# Patient Record
Sex: Male | Born: 1950
Health system: Southern US, Community
[De-identification: ages and names within clinical notes are randomized; demographics above are authoritative.]

## PROBLEM LIST (undated history)

## (undated) DIAGNOSIS — I10 Essential (primary) hypertension: Secondary | ICD-10-CM

## (undated) DIAGNOSIS — E785 Hyperlipidemia, unspecified: Secondary | ICD-10-CM

## (undated) DIAGNOSIS — R079 Chest pain, unspecified: Secondary | ICD-10-CM

## (undated) DIAGNOSIS — M1990 Unspecified osteoarthritis, unspecified site: Secondary | ICD-10-CM

## (undated) HISTORY — PX: OTHER SURGICAL HISTORY: SHX169

## (undated) HISTORY — DX: Essential (primary) hypertension: I10

## (undated) HISTORY — DX: Hyperlipidemia, unspecified: E78.5

## (undated) HISTORY — DX: Chest pain, unspecified: R07.9

## (undated) HISTORY — PX: KNEE ARTHROSCOPY: SUR90

## (undated) HISTORY — PX: TONSILLECTOMY: SUR1361

---

## 2003-11-20 ENCOUNTER — Ambulatory Visit (HOSPITAL_COMMUNITY): Admission: RE | Admit: 2003-11-20 | Discharge: 2003-11-20 | Payer: Self-pay | Admitting: *Deleted

## 2013-06-24 ENCOUNTER — Other Ambulatory Visit: Payer: Self-pay | Admitting: Orthopedic Surgery

## 2013-07-04 ENCOUNTER — Other Ambulatory Visit: Payer: Self-pay | Admitting: Orthopedic Surgery

## 2013-07-04 NOTE — Progress Notes (Signed)
Preoperative surgical orders have been place into the Epic hospital system for Verne Carrow on 07/04/2013, 12:47 PM  by Patrica Duel for surgery on 07/21/2013.  Preop Total Knee orders including Experal, PO Tylenol, and IV Decadron as long as there are no contraindications to the above medications. Avel Peace, PA-C

## 2013-07-15 ENCOUNTER — Other Ambulatory Visit (HOSPITAL_COMMUNITY): Payer: Self-pay | Admitting: Orthopedic Surgery

## 2013-07-15 NOTE — Patient Instructions (Addendum)
20 Scott Salas  07/15/2013   Your procedure is scheduled on: 07-21-2013  Report to [facility] at 625 AM.  Call this number if you have problems the morning of surgery [phone]   Remember:   Do not eat food or drink liquids :After Midnight.     Take these medicines the morning of surgery with A SIP OF WATER: Tramadol if needed                                SEE [facility] PREPARING FOR SURGERY SHEET             You may not have any metal on your body including hair pins and piercings  Do not wear jewelry, make-up.  Do not wear lotions, powders, or perfumes. You may wear deodorant.   Men may shave face and neck.  Do not bring valuables to the hospital. [facility] IS NOT RESPONSIBLE FOR VALUEABLES.  Contacts, dentures or bridgework may not be worn into surgery.  Leave suitcase in the car. After surgery it may be brought to your room.  For patients admitted to the hospital, checkout time is 11:00 AM the day of discharge.   Patients discharged the day of surgery will not be allowed to drive home.  Name and phone number of your driver:  Special Instructions: N/A   Please read over the following fact sheets that you were given: mrsa information, blood fact sheet, incentive spirometer sheet  Call Merleen Nicely  RN pre op nurse if needed [phone]    FAILURE TO FOLLOW THESE INSTRUCTIONS MAY RESULT IN THE CANCELLATION OF YOUR SURGERY.  PATIENT SIGNATURE___________________________________________  NURSE SIGNATURE_____________________________________________

## 2013-07-15 NOTE — Progress Notes (Signed)
Medical clearance note and ekg 05-19-2013 dr Molly Maduro fried on chart

## 2013-07-16 ENCOUNTER — Encounter (HOSPITAL_COMMUNITY): Payer: Self-pay

## 2013-07-16 ENCOUNTER — Encounter (HOSPITAL_COMMUNITY): Payer: Self-pay | Admitting: Pharmacy Technician

## 2013-07-16 ENCOUNTER — Encounter (HOSPITAL_COMMUNITY)
Admission: RE | Admit: 2013-07-16 | Discharge: 2013-07-16 | Disposition: A | Discharge status: Home / Self Care | Payer: BC Managed Care – PPO | Source: Ambulatory Visit | Attending: Orthopedic Surgery | Admitting: Orthopedic Surgery

## 2013-07-16 DIAGNOSIS — Z01812 Encounter for preprocedural laboratory examination: Secondary | ICD-10-CM | POA: Insufficient documentation

## 2013-07-16 DIAGNOSIS — Z01818 Encounter for other preprocedural examination: Secondary | ICD-10-CM | POA: Insufficient documentation

## 2013-07-16 HISTORY — DX: Unspecified osteoarthritis, unspecified site: M19.90

## 2013-07-16 LAB — SURGICAL PCR SCREEN
MRSA, PCR: NEGATIVE
Staphylococcus aureus: NEGATIVE

## 2013-07-16 LAB — URINALYSIS, ROUTINE W REFLEX MICROSCOPIC
Ketones, ur: NEGATIVE mg/dL
Nitrite: NEGATIVE
Specific Gravity, Urine: 1.024 (ref 1.005–1.030)
Urobilinogen, UA: 0.2 mg/dL (ref 0.0–1.0)

## 2013-07-16 LAB — COMPREHENSIVE METABOLIC PANEL
Albumin: 3.6 g/dL (ref 3.5–5.2)
BUN: 17 mg/dL (ref 6–23)
Calcium: 9.8 mg/dL (ref 8.4–10.5)
Creatinine, Ser: 0.98 mg/dL (ref 0.50–1.35)
GFR calc Af Amer: >90 mL/min (ref >90)
GFR calc non Af Amer: 88 mL/min — ABNORMAL LOW (ref >90)
Glucose, Bld: 94 mg/dL (ref 70–99)
Potassium: 4.9 mEq/L (ref 3.5–5.1)
Total Protein: 7.4 g/dL (ref 6.0–8.3)

## 2013-07-16 LAB — CBC
HCT: 41.8 % (ref 39.0–52.0)
MCH: 32.4 pg (ref 26.0–34.0)
MCHC: 34 g/dL (ref 30.0–36.0)
RDW: 12.2 % (ref 11.5–15.5)
WBC: 6.8 10*3/uL (ref 4.0–10.5)

## 2013-07-16 LAB — PROTIME-INR
INR: 0.98 (ref 0.00–1.49)
Prothrombin Time: 12.8 seconds (ref 11.6–15.2)

## 2013-07-16 LAB — APTT: aPTT: 26 seconds (ref 24–37)

## 2013-07-20 ENCOUNTER — Encounter (HOSPITAL_COMMUNITY): Payer: Self-pay | Admitting: Orthopedic Surgery

## 2013-07-20 ENCOUNTER — Other Ambulatory Visit: Payer: Self-pay | Admitting: Orthopedic Surgery

## 2013-07-20 NOTE — H&P (Signed)
TOTAL KNEE ADMISSION H&P  Patient is being admitted for right total knee arthroplasty.  Subjective:  Chief Complaint:right knee pain.  HPI: Scott Salas, 62 y.o. male, has a history of pain and functional disability in the right knee due to arthritis and has failed non-surgical conservative treatments for greater than 12 weeks to includeNSAID's and/or analgesics, viscosupplementation injections and activity modification.  Onset of symptoms was gradual, with gradually worsening course since that time. The patient noted prior procedures on the knee to include  arthroscopy on the right knee(s).  Patient currently rates pain in the right knee(s) at moderate with activity. Patient has worsening of pain with activity and weight bearing and pain that interferes with activities of daily living.  Patient has evidence of joint space narrowing and bone on bone changes by imaging studies. This patient has had failure of conservative measures. There is no active infection.  There are no active problems to display for this patient.  Past Medical History  Diagnosis Date  . Arthritis     Past Surgical History  Procedure Laterality Date  . Tonsillectomy      as child  . Acl left knee      left knee  . Knee arthroscopy      right     (Not in a hospital admission) No Known Allergies  History  Substance Use Topics  . Smoking status: Former Smoker  . Smokeless tobacco: Former User    Quit date: 07/03/1982  . Alcohol Use: Yes     Comment: occassionally    Family History  Problem Relation Age of Onset  . Heart failure Father   . Heart disease Father   . Cancer Maternal Grandfather      Review of Systems  Constitutional: Negative.   HENT: Negative.   Respiratory: Negative.   Cardiovascular: Negative.   Gastrointestinal: Negative.   Genitourinary: Negative.   Musculoskeletal: Positive for joint pain.  Neurological: Negative.     Objective:  Physical Exam  Constitutional: He appears  well-developed and well-nourished. No distress.  HENT:  Head: Normocephalic and atraumatic.  Eyes: EOM are normal. Pupils are equal, round, and reactive to light.  Neck: Neck supple.  Cardiovascular: Normal rate and regular rhythm.   Murmur (II/VI low pitched early systolic murmur over the aoritc area) heard. Respiratory: Breath sounds normal.  GI: Soft. Bowel sounds are normal. There is no tenderness.  Musculoskeletal:       Right knee: He exhibits decreased range of motion (5-125, moderate cepitus) and abnormal alignment (varus). He exhibits no effusion, no ecchymosis and no erythema. Tenderness found. Medial joint line and lateral joint line tenderness noted.    Vital signs in last 24 hours: Ht 70 Wt 189 BP 122/70 Resp 12  Labs:   Estimated body mass index is 28.28 kg/(m^2) as calculated from the following:   Height as of 07/16/13: 5' 9" (1.753 m).   Weight as of 07/16/13: 86.909 kg (191 lb 9.6 oz).   Imaging Review Plain radiographs demonstrate severe degenerative joint disease of the right knee(s). The overall alignment is varus. The bone quality appears to be good for age and reported activity level.  Assessment/Plan:  End stage arthritis, right knee   The patient history, physical examination, clinical judgment of the provider and imaging studies are consistent with end stage degenerative joint disease of the right knee(s) and total knee arthroplasty is deemed medically necessary. The treatment options including medical management, injection therapy arthroscopy and arthroplasty were discussed at   length. The risks and benefits of total knee arthroplasty were presented and reviewed. The risks due to aseptic loosening, infection, stiffness, patella tracking problems, thromboembolic complications and other imponderables were discussed. The patient acknowledged the explanation, agreed to proceed with the plan and consent was signed. Patient is being admitted for inpatient treatment  for surgery, pain control, PT, OT, prophylactic antibiotics, VTE prophylaxis, progressive ambulation and ADL's and discharge planning. The patient is planning to be discharged home with home health services.  Plan is for a Right Total Knee Replacement by Dr. Aluisio.  PCP - Dr. Robert Fried - The patient has been seen preoperatively and felt to be stable for surgery.  The patient WILL receive TXA (tranexamic acid) prior to surgery.    

## 2013-07-21 ENCOUNTER — Inpatient Hospital Stay (HOSPITAL_COMMUNITY)
Admission: RE | Admit: 2013-07-21 | Discharge: 2013-07-22 | DRG: 209 | Disposition: A | Discharge status: Home / Self Care | Payer: BC Managed Care – PPO | Source: Ambulatory Visit | Attending: Orthopedic Surgery | Admitting: Orthopedic Surgery

## 2013-07-21 ENCOUNTER — Encounter (HOSPITAL_COMMUNITY): Payer: Self-pay | Admitting: *Deleted

## 2013-07-21 ENCOUNTER — Encounter (HOSPITAL_COMMUNITY)
Admission: RE | Disposition: A | Discharge status: Home Health | Payer: Self-pay | Source: Ambulatory Visit | Attending: Orthopedic Surgery

## 2013-07-21 ENCOUNTER — Inpatient Hospital Stay (HOSPITAL_COMMUNITY): Payer: BC Managed Care – PPO | Admitting: Anesthesiology

## 2013-07-21 ENCOUNTER — Encounter (HOSPITAL_COMMUNITY): Payer: Self-pay | Admitting: Anesthesiology

## 2013-07-21 DIAGNOSIS — M1710 Unilateral primary osteoarthritis, unspecified knee: Principal | ICD-10-CM | POA: Diagnosis present

## 2013-07-21 DIAGNOSIS — E871 Hypo-osmolality and hyponatremia: Secondary | ICD-10-CM

## 2013-07-21 DIAGNOSIS — M179 Osteoarthritis of knee, unspecified: Secondary | ICD-10-CM | POA: Diagnosis present

## 2013-07-21 DIAGNOSIS — Z96651 Presence of right artificial knee joint: Secondary | ICD-10-CM

## 2013-07-21 DIAGNOSIS — Z01812 Encounter for preprocedural laboratory examination: Secondary | ICD-10-CM

## 2013-07-21 HISTORY — PX: TOTAL KNEE ARTHROPLASTY: SHX125

## 2013-07-21 LAB — TYPE AND SCREEN
ABO/RH(D): O POS
Antibody Screen: NEGATIVE

## 2013-07-21 SURGERY — ARTHROPLASTY, KNEE, TOTAL
Anesthesia: Spinal | Site: Knee | Laterality: Right | Wound class: Clean

## 2013-07-21 MED ORDER — SODIUM CHLORIDE 0.9 % IJ SOLN
INTRAMUSCULAR | Status: DC | PRN
  Administered 2013-07-21: 10:00:00

## 2013-07-21 MED ORDER — OXYCODONE HCL 5 MG PO TABS
5.0000 mg | ORAL_TABLET | ORAL | Status: DC | PRN
  Administered 2013-07-21 – 2013-07-22 (×6): 10 mg via ORAL
  Filled 2013-07-21 (×4): qty 2
  Filled 2013-07-21: qty 1
  Filled 2013-07-21 (×2): qty 2

## 2013-07-21 MED ORDER — DEXAMETHASONE 6 MG PO TABS
10.0000 mg | ORAL_TABLET | Freq: Every day | ORAL | Status: AC
  Administered 2013-07-22: 10:00:00 10 mg via ORAL
  Filled 2013-07-21: qty 1

## 2013-07-21 MED ORDER — PHENOL 1.4 % MT LIQD: 1.0000 | OROMUCOSAL | Status: DC | PRN

## 2013-07-21 MED ORDER — BISACODYL 10 MG RE SUPP: 10.0000 mg | Freq: Every day | RECTAL | Status: DC | PRN

## 2013-07-21 MED ORDER — BUPIVACAINE HCL (PF) 0.25 % IJ SOLN
INTRAMUSCULAR | Status: AC
  Filled 2013-07-21: qty 30

## 2013-07-21 MED ORDER — MORPHINE SULFATE 2 MG/ML IJ SOLN
1.0000 mg | INTRAMUSCULAR | Status: DC | PRN
  Administered 2013-07-21: 2 mg via INTRAVENOUS
  Filled 2013-07-21: qty 1

## 2013-07-21 MED ORDER — BUPIVACAINE HCL 0.25 % IJ SOLN
INTRAMUSCULAR | Status: DC | PRN
  Administered 2013-07-21: 20 mL

## 2013-07-21 MED ORDER — RIVAROXABAN 10 MG PO TABS
10.0000 mg | ORAL_TABLET | Freq: Every day | ORAL | Status: DC
  Administered 2013-07-22: 10 mg via ORAL
  Filled 2013-07-21 (×2): qty 1

## 2013-07-21 MED ORDER — ONDANSETRON HCL 4 MG/2ML IJ SOLN: 4.0000 mg | Freq: Four times a day (QID) | INTRAMUSCULAR | Status: DC | PRN

## 2013-07-21 MED ORDER — METOCLOPRAMIDE HCL 10 MG PO TABS: 5.0000 mg | ORAL_TABLET | Freq: Three times a day (TID) | ORAL | Status: DC | PRN

## 2013-07-21 MED ORDER — DIPHENHYDRAMINE HCL 12.5 MG/5ML PO ELIX: 12.5000 mg | ORAL_SOLUTION | ORAL | Status: DC | PRN

## 2013-07-21 MED ORDER — CEFAZOLIN SODIUM-DEXTROSE 2-3 GM-% IV SOLR
INTRAVENOUS | Status: AC
  Filled 2013-07-21: qty 50

## 2013-07-21 MED ORDER — METHOCARBAMOL 500 MG PO TABS
500.0000 mg | ORAL_TABLET | Freq: Four times a day (QID) | ORAL | Status: DC | PRN
  Administered 2013-07-21 – 2013-07-22 (×2): 500 mg via ORAL
  Filled 2013-07-21 (×2): qty 1

## 2013-07-21 MED ORDER — DEXTROSE-NACL 5-0.9 % IV SOLN
INTRAVENOUS | Status: DC
  Administered 2013-07-21 – 2013-07-22 (×2): via INTRAVENOUS

## 2013-07-21 MED ORDER — BUPIVACAINE LIPOSOME 1.3 % IJ SUSP
20.0000 mL | Freq: Once | INTRAMUSCULAR | Status: DC
  Filled 2013-07-21: qty 20

## 2013-07-21 MED ORDER — PROPOFOL INFUSION 10 MG/ML OPTIME
INTRAVENOUS | Status: DC | PRN
  Administered 2013-07-21: 100 ug/kg/min via INTRAVENOUS

## 2013-07-21 MED ORDER — PROPOFOL 10 MG/ML IV BOLUS
INTRAVENOUS | Status: DC | PRN
  Administered 2013-07-21: 20 mg via INTRAVENOUS

## 2013-07-21 MED ORDER — METOCLOPRAMIDE HCL 5 MG/ML IJ SOLN: 5.0000 mg | Freq: Three times a day (TID) | INTRAMUSCULAR | Status: DC | PRN

## 2013-07-21 MED ORDER — LACTATED RINGERS IV SOLN: INTRAVENOUS | Status: DC

## 2013-07-21 MED ORDER — ONDANSETRON HCL 4 MG PO TABS: 4.0000 mg | ORAL_TABLET | Freq: Four times a day (QID) | ORAL | Status: DC | PRN

## 2013-07-21 MED ORDER — CEFAZOLIN SODIUM 1-5 GM-% IV SOLN
1.0000 g | Freq: Four times a day (QID) | INTRAVENOUS | Status: AC
  Administered 2013-07-21 – 2013-07-22 (×2): 1 g via INTRAVENOUS
  Filled 2013-07-21 (×2): qty 50

## 2013-07-21 MED ORDER — BUPIVACAINE IN DEXTROSE 0.75-8.25 % IT SOLN
INTRATHECAL | Status: DC | PRN
  Administered 2013-07-21: 2 mL via INTRATHECAL

## 2013-07-21 MED ORDER — FLUTICASONE PROPIONATE 50 MCG/ACT NA SUSP: 1.0000 | Freq: Every day | NASAL | Status: DC | PRN

## 2013-07-21 MED ORDER — DOCUSATE SODIUM 100 MG PO CAPS
100.0000 mg | ORAL_CAPSULE | Freq: Two times a day (BID) | ORAL | Status: DC
  Administered 2013-07-21 – 2013-07-22 (×2): 100 mg via ORAL

## 2013-07-21 MED ORDER — DEXAMETHASONE SODIUM PHOSPHATE 10 MG/ML IJ SOLN
10.0000 mg | Freq: Every day | INTRAMUSCULAR | Status: AC
  Filled 2013-07-21: qty 1

## 2013-07-21 MED ORDER — FENTANYL CITRATE 0.05 MG/ML IJ SOLN
INTRAMUSCULAR | Status: DC | PRN
  Administered 2013-07-21: 100 ug via INTRAVENOUS

## 2013-07-21 MED ORDER — KETAMINE HCL 10 MG/ML IJ SOLN
INTRAMUSCULAR | Status: DC | PRN
  Administered 2013-07-21 (×4): 10 mg via INTRAVENOUS

## 2013-07-21 MED ORDER — MENTHOL 3 MG MT LOZG
1.0000 | LOZENGE | OROMUCOSAL | Status: DC | PRN
  Filled 2013-07-21: qty 9

## 2013-07-21 MED ORDER — KETOROLAC TROMETHAMINE 15 MG/ML IJ SOLN
15.0000 mg | Freq: Four times a day (QID) | INTRAMUSCULAR | Status: AC | PRN
  Administered 2013-07-21 – 2013-07-22 (×3): 15 mg via INTRAVENOUS
  Filled 2013-07-21 (×3): qty 1

## 2013-07-21 MED ORDER — LACTATED RINGERS IV SOLN
INTRAVENOUS | Status: DC | PRN
  Administered 2013-07-21 (×2): via INTRAVENOUS

## 2013-07-21 MED ORDER — ACETAMINOPHEN 500 MG PO TABS
1000.0000 mg | ORAL_TABLET | Freq: Four times a day (QID) | ORAL | Status: AC
  Administered 2013-07-21 – 2013-07-22 (×4): 1000 mg via ORAL
  Filled 2013-07-21 (×5): qty 2

## 2013-07-21 MED ORDER — FLEET ENEMA 7-19 GM/118ML RE ENEM: 1.0000 | ENEMA | Freq: Once | RECTAL | Status: AC | PRN

## 2013-07-21 MED ORDER — ACETAMINOPHEN 500 MG PO TABS
1000.0000 mg | ORAL_TABLET | Freq: Once | ORAL | Status: AC
  Administered 2013-07-21: 1000 mg via ORAL
  Filled 2013-07-21: qty 2

## 2013-07-21 MED ORDER — DEXTROSE 5 % IV SOLN
500.0000 mg | Freq: Four times a day (QID) | INTRAVENOUS | Status: DC | PRN
  Administered 2013-07-21: 500 mg via INTRAVENOUS
  Filled 2013-07-21: qty 5

## 2013-07-21 MED ORDER — TRANEXAMIC ACID 100 MG/ML IV SOLN
1000.0000 mg | INTRAVENOUS | Status: AC
  Administered 2013-07-21: 1000 mg via INTRAVENOUS
  Filled 2013-07-21: qty 10

## 2013-07-21 MED ORDER — SODIUM CHLORIDE 0.9 % IJ SOLN
INTRAMUSCULAR | Status: AC
  Filled 2013-07-21: qty 50

## 2013-07-21 MED ORDER — TRAMADOL HCL 50 MG PO TABS: 50.0000 mg | ORAL_TABLET | Freq: Four times a day (QID) | ORAL | Status: DC | PRN

## 2013-07-21 MED ORDER — CEFAZOLIN SODIUM-DEXTROSE 2-3 GM-% IV SOLR
2.0000 g | INTRAVENOUS | Status: AC
  Administered 2013-07-21: 2 g via INTRAVENOUS

## 2013-07-21 MED ORDER — DEXAMETHASONE SODIUM PHOSPHATE 10 MG/ML IJ SOLN
10.0000 mg | Freq: Once | INTRAMUSCULAR | Status: AC
  Administered 2013-07-21: 10 mg via INTRAVENOUS

## 2013-07-21 MED ORDER — MIDAZOLAM HCL 5 MG/5ML IJ SOLN
INTRAMUSCULAR | Status: DC | PRN
  Administered 2013-07-21: 2 mg via INTRAVENOUS

## 2013-07-21 MED ORDER — SODIUM CHLORIDE 0.9 % IV SOLN: INTRAVENOUS | Status: DC

## 2013-07-21 MED ORDER — POLYETHYLENE GLYCOL 3350 17 G PO PACK: 17.0000 g | PACK | Freq: Every day | ORAL | Status: DC | PRN

## 2013-07-21 SURGICAL SUPPLY — 55 items
BAG ZIPLOCK 12X15 (MISCELLANEOUS) ×2 IMPLANT
BANDAGE ELASTIC 6 VELCRO ST LF (GAUZE/BANDAGES/DRESSINGS) ×2 IMPLANT
BANDAGE ESMARK 6X9 LF (GAUZE/BANDAGES/DRESSINGS) ×1 IMPLANT
BLADE SAG 18X100X1.27 (BLADE) ×2 IMPLANT
BLADE SAW SGTL 11.0X1.19X90.0M (BLADE) ×2 IMPLANT
BNDG ESMARK 6X9 LF (GAUZE/BANDAGES/DRESSINGS) ×2
BOWL SMART MIX CTS (DISPOSABLE) ×2 IMPLANT
CAPT RP KNEE ×2 IMPLANT
CEMENT HV SMART SET (Cement) ×4 IMPLANT
CLOTH BEACON ORANGE TIMEOUT ST (SAFETY) ×2 IMPLANT
CUFF TOURN SGL QUICK 34 (TOURNIQUET CUFF) ×1
CUFF TRNQT CYL 34X4X40X1 (TOURNIQUET CUFF) ×1 IMPLANT
DECANTER SPIKE VIAL GLASS SM (MISCELLANEOUS) ×2 IMPLANT
DRAPE EXTREMITY T 121X128X90 (DRAPE) ×2 IMPLANT
DRAPE POUCH INSTRU U-SHP 10X18 (DRAPES) ×2 IMPLANT
DRAPE U-SHAPE 47X51 STRL (DRAPES) ×2 IMPLANT
DRSG ADAPTIC 3X8 NADH LF (GAUZE/BANDAGES/DRESSINGS) ×2 IMPLANT
DRSG PAD ABDOMINAL 8X10 ST (GAUZE/BANDAGES/DRESSINGS) ×2 IMPLANT
DURAPREP 26ML APPLICATOR (WOUND CARE) ×2 IMPLANT
ELECT REM PT RETURN 9FT ADLT (ELECTROSURGICAL) ×2
ELECTRODE REM PT RTRN 9FT ADLT (ELECTROSURGICAL) ×1 IMPLANT
EVACUATOR 1/8 PVC DRAIN (DRAIN) ×2 IMPLANT
FACESHIELD LNG OPTICON STERILE (SAFETY) ×10 IMPLANT
GLOVE BIO SURGEON STRL SZ7.5 (GLOVE) ×2 IMPLANT
GLOVE BIO SURGEON STRL SZ8 (GLOVE) ×12 IMPLANT
GLOVE BIOGEL PI IND STRL 8 (GLOVE) ×2 IMPLANT
GLOVE BIOGEL PI INDICATOR 8 (GLOVE) ×2
GLOVE SURG SS PI 6.5 STRL IVOR (GLOVE) ×2 IMPLANT
GOWN PREVENTION PLUS LG XLONG (DISPOSABLE) ×2 IMPLANT
GOWN STRL REIN XL XLG (GOWN DISPOSABLE) ×6 IMPLANT
HANDPIECE INTERPULSE COAX TIP (DISPOSABLE) ×1
IMMOBILIZER KNEE 20 (SOFTGOODS) ×2
IMMOBILIZER KNEE 20 THIGH 36 (SOFTGOODS) ×1 IMPLANT
KIT BASIN OR (CUSTOM PROCEDURE TRAY) ×2 IMPLANT
MANIFOLD NEPTUNE II (INSTRUMENTS) ×2 IMPLANT
NDL SAFETY ECLIPSE 18X1.5 (NEEDLE) ×2 IMPLANT
NEEDLE HYPO 18GX1.5 SHARP (NEEDLE) ×2
NS IRRIG 1000ML POUR BTL (IV SOLUTION) ×2 IMPLANT
PACK TOTAL JOINT (CUSTOM PROCEDURE TRAY) ×2 IMPLANT
PADDING CAST COTTON 6X4 STRL (CAST SUPPLIES) ×6 IMPLANT
POSITIONER SURGICAL ARM (MISCELLANEOUS) ×2 IMPLANT
SET HNDPC FAN SPRY TIP SCT (DISPOSABLE) ×1 IMPLANT
SPONGE GAUZE 4X4 12PLY (GAUZE/BANDAGES/DRESSINGS) ×2 IMPLANT
STRIP CLOSURE SKIN 1/2X4 (GAUZE/BANDAGES/DRESSINGS) ×4 IMPLANT
SUCTION FRAZIER 12FR DISP (SUCTIONS) ×2 IMPLANT
SUT MNCRL AB 4-0 PS2 18 (SUTURE) ×2 IMPLANT
SUT VIC AB 2-0 CT1 27 (SUTURE) ×3
SUT VIC AB 2-0 CT1 TAPERPNT 27 (SUTURE) ×3 IMPLANT
SUT VLOC 180 0 24IN GS25 (SUTURE) IMPLANT
SYR 20CC LL (SYRINGE) ×2 IMPLANT
SYR 50ML LL SCALE MARK (SYRINGE) ×2 IMPLANT
TOWEL OR 17X26 10 PK STRL BLUE (TOWEL DISPOSABLE) ×4 IMPLANT
TRAY FOLEY CATH 14FRSI W/METER (CATHETERS) ×2 IMPLANT
WATER STERILE IRR 1500ML POUR (IV SOLUTION) ×4 IMPLANT
WRAP KNEE MAXI GEL POST OP (GAUZE/BANDAGES/DRESSINGS) ×2 IMPLANT

## 2013-07-21 NOTE — Interval H&P Note (Signed)
History and Physical Interval Note:  07/21/2013 7:01 AM  Scott Salas  has presented today for surgery, with the diagnosis of osteoarthritis of the right knee  The various methods of treatment have been discussed with the patient and family. After consideration of risks, benefits and other options for treatment, the patient has consented to  Procedure(s): RIGHT TOTAL KNEE ARTHROPLASTY (Right) as a surgical intervention .  The patient's history has been reviewed, patient examined, no change in status, stable for surgery.  I have reviewed the patient's chart and labs.  Questions were answered to the patient's satisfaction.     Loanne Drilling

## 2013-07-21 NOTE — Transfer of Care (Signed)
Immediate Anesthesia Transfer of Care Note  Patient: Scott Salas  Procedure(s) Performed: Procedure(s) (LRB): RIGHT TOTAL KNEE ARTHROPLASTY (Right)  Patient Location: PACU  Anesthesia Type: Spinal  Level of Consciousness: sedated, patient cooperative and responds to stimulation  Airway & Oxygen Therapy: Patient Spontanous Breathing and Patient connected to face mask oxgen  Post-op Assessment: Report given to PACU RN and Post -op Vital signs reviewed and stable  Post vital signs: Reviewed and stable Noted L1 level on exam denied pain.  Complications: No apparent anesthesia complications

## 2013-07-21 NOTE — Anesthesia Postprocedure Evaluation (Signed)
Anesthesia Post Note  Patient: Scott Salas  Procedure(s) Performed: Procedure(s) (LRB): RIGHT TOTAL KNEE ARTHROPLASTY (Right)  Anesthesia type: Spinal  Patient location: PACU  Post pain: Pain level controlled  Post assessment: Post-op Vital signs reviewed  Last Vitals: BP 132/87  Pulse 60  Temp(Src) 36.8 C (Oral)  Resp 16  Ht 5\' 9"  (1.753 m)  Wt 191 lb (86.637 kg)  BMI 28.19 kg/m2  SpO2 100%  Post vital signs: Reviewed  Level of consciousness: sedated  Complications: No apparent anesthesia complications

## 2013-07-21 NOTE — Progress Notes (Signed)
Utilization review completed.  

## 2013-07-21 NOTE — Op Note (Signed)
Pre-operative diagnosis- Osteoarthritis  Right knee(s)  Post-operative diagnosis- Osteoarthritis Right knee(s)  Procedure-  Right  Total Knee Arthroplasty  Surgeon- Scott Salas. Scott Kimbell, MD  Assistant- Scott Ped, PA-C   Anesthesia-  Spinal EBL-* No blood loss amount entered *  Drains Hemovac  Tourniquet time-  Total Tourniquet Time Documented: Thigh (Right) - 35 minutes Total: Thigh (Right) - 35 minutes    Complications- None  Condition-PACU - hemodynamically stable.   Brief Clinical Note  Scott Salas is a 62 y.o. year old male with end stage OA of his right knee with progressively worsening pain and dysfunction. He has constant pain, with activity and at rest and significant functional deficits with difficulties even with ADLs. He has had extensive non-op management including analgesics, injections of cortisone, and home exercise program, but remains in significant pain with significant dysfunction. Radiographs show bone on bone arthritis medial and patellofemoral. He presents now for rightt Total Knee Arthroplasty.    Procedure in detail---   The patient is brought into the operating room and positioned supine on the operating table. After successful administration of  Spinal,   a tourniquet is placed high on the  Right thigh(s) and the lower extremity is prepped and draped in the usual sterile fashion. Time out is performed by the operating team and then the  Right lower extremity is wrapped in Esmarch, knee flexed and the tourniquet inflated to 300 mmHg.       A midline incision is made with a ten blade through the subcutaneous tissue to the level of the extensor mechanism. A fresh blade is used to make a medial parapatellar arthrotomy. Soft tissue over the proximal medial tibia is subperiosteally elevated to the joint line with a knife and into the semimembranosus bursa with a Cobb elevator. Soft tissue over the proximal lateral tibia is elevated with attention being paid to  avoiding the patellar tendon on the tibial tubercle. The patella is everted, knee flexed 90 degrees and the ACL and PCL are removed. Findings are bone on bone medial and patellofemoral with large medial and patellar osteophytes.        The drill is used to create a starting hole in the distal femur and the canal is thoroughly irrigated with sterile saline to remove the fatty contents. The 5 degree Right  valgus alignment guide is placed into the femoral canal and the distal femoral cutting block is pinned to remove 10 mm off the distal femur. Resection is made with an oscillating saw.      The tibia is subluxed forward and the menisci are removed. The extramedullary alignment guide is placed referencing proximally at the medial aspect of the tibial tubercle and distally along the second metatarsal axis and tibial crest. The block is pinned to remove 2mm off the more deficient medial  side. Resection is made with an oscillating saw. Size 4is the most appropriate size for the tibia and the proximal tibia is prepared with the modular drill and keel punch for that size.      The femoral sizing guide is placed and size 4 is most appropriate. Rotation is marked off the epicondylar axis and confirmed by creating a rectangular flexion gap at 90 degrees. The size 4 cutting block is pinned in this rotation and the anterior, posterior and chamfer cuts are made with the oscillating saw. The intercondylar block is then placed and that cut is made.      Trial size 4 tibial component, trial size 4  posterior stabilized femur and a 10  mm posterior stabilized rotating platform insert trial is placed. Full extension is achieved with excellent varus/valgus and anterior/posterior balance throughout full range of motion. The patella is everted and thickness measured to be 27  mm. Free hand resection is taken to 15 mm, a 41 template is placed, lug holes are drilled, trial patella is placed, and it tracks normally. Osteophytes are  removed off the posterior femur with the trial in place. All trials are removed and the cut bone surfaces prepared with pulsatile lavage. Cement is mixed and once ready for implantation, the size 4 tibial implant, size  4 posterior stabilized femoral component, and the size 41 patella are cemented in place and the patella is held with the clamp. The trial insert is placed and the knee held in full extension. The Exparel (20 ml mixed with 30 ml saline) and .25% Bupivicaine, are injected into the extensor mechanism, posterior capsule, medial and lateral gutters and subcutaneous tissues.  All extruded cement is removed and once the cement is hard the permanent 10 mm posterior stabilized rotating platform insert is placed into the tibial tray.      The wound is copiously irrigated with saline solution and the extensor mechanism closed over a hemovac drain with #1 PDS suture. The tourniquet is released for a total tourniquet time of 35  minutes. Flexion against gravity is 140 degrees and the patella tracks normally. Subcutaneous tissue is closed with 2.0 vicryl and subcuticular with running 4.0 Monocryl. The incision is cleaned and dried and steri-strips and a bulky sterile dressing are applied. The limb is placed into a knee immobilizer and the patient is awakened and transported to recovery in stable condition.      Please note that a surgical assistant was a medical necessity for this procedure in order to perform it in a safe and expeditious manner. Surgical assistant was necessary to retract the ligaments and vital neurovascular structures to prevent injury to them and also necessary for proper positioning of the limb to allow for anatomic placement of the prosthesis.   Scott Salas Scott Lungren, MD    07/21/2013, 10:20 AM

## 2013-07-21 NOTE — Anesthesia Procedure Notes (Signed)
Spinal  Patient location during procedure: OR Start time: 07/21/2013 9:22 AM End time: 07/21/2013 9:25 AM Staffing Anesthesiologist: Lewie Loron R Performed by: anesthesiologist  Preanesthetic Checklist Completed: patient identified, site marked, surgical consent, pre-op evaluation, timeout performed, IV checked, risks and benefits discussed and monitors and equipment checked Spinal Block Patient position: sitting Prep: ChloraPrep and site prepped and draped Patient monitoring: heart rate, continuous pulse ox and blood pressure Approach: midline Location: L2-3 Injection technique: single-shot Needle Needle type: Sprotte  Needle gauge: 24 G Needle length: 9 cm Assessment Sensory level: T8 Additional Notes Expiration date of kit checked and confirmed. Patient tolerated procedure well, without complications.

## 2013-07-21 NOTE — H&P (View-Only) (Signed)
TOTAL KNEE ADMISSION H&P  Patient is being admitted for right total knee arthroplasty.  Subjective:  Chief Complaint:right knee pain.  HPI: Scott Salas, 62 y.o. male, has a history of pain and functional disability in the right knee due to arthritis and has failed non-surgical conservative treatments for greater than 12 weeks to includeNSAID's and/or analgesics, viscosupplementation injections and activity modification.  Onset of symptoms was gradual, with gradually worsening course since that time. The patient noted prior procedures on the knee to include  arthroscopy on the right knee(s).  Patient currently rates pain in the right knee(s) at moderate with activity. Patient has worsening of pain with activity and weight bearing and pain that interferes with activities of daily living.  Patient has evidence of joint space narrowing and bone on bone changes by imaging studies. This patient has had failure of conservative measures. There is no active infection.  There are no active problems to display for this patient.  Past Medical History  Diagnosis Date  . Arthritis     Past Surgical History  Procedure Laterality Date  . Tonsillectomy      as child  . Acl left knee      left knee  . Knee arthroscopy      right     (Not in a hospital admission) No Known Allergies  History  Substance Use Topics  . Smoking status: Former Games developer  . Smokeless tobacco: Former Neurosurgeon    Quit date: 07/03/1982  . Alcohol Use: Yes     Comment: occassionally    Family History  Problem Relation Age of Onset  . Heart failure Father   . Heart disease Father   . Cancer Maternal Grandfather      Review of Systems  Constitutional: Negative.   HENT: Negative.   Respiratory: Negative.   Cardiovascular: Negative.   Gastrointestinal: Negative.   Genitourinary: Negative.   Musculoskeletal: Positive for joint pain.  Neurological: Negative.     Objective:  Physical Exam  Constitutional: He appears  well-developed and well-nourished. No distress.  HENT:  Head: Normocephalic and atraumatic.  Eyes: EOM are normal. Pupils are equal, round, and reactive to light.  Neck: Neck supple.  Cardiovascular: Normal rate and regular rhythm.   Murmur (II/VI low pitched early systolic murmur over the aoritc area) heard. Respiratory: Breath sounds normal.  GI: Soft. Bowel sounds are normal. There is no tenderness.  Musculoskeletal:       Right knee: He exhibits decreased range of motion (5-125, moderate cepitus) and abnormal alignment (varus). He exhibits no effusion, no ecchymosis and no erythema. Tenderness found. Medial joint line and lateral joint line tenderness noted.    Vital signs in last 24 hours: Ht 70 Wt 189 BP 122/70 Resp 12  Labs:   Estimated body mass index is 28.28 kg/(m^2) as calculated from the following:   Height as of 07/16/13: 5\' 9"  (1.753 m).   Weight as of 07/16/13: 86.909 kg (191 lb 9.6 oz).   Imaging Review Plain radiographs demonstrate severe degenerative joint disease of the right knee(s). The overall alignment is varus. The bone quality appears to be good for age and reported activity level.  Assessment/Plan:  End stage arthritis, right knee   The patient history, physical examination, clinical judgment of the provider and imaging studies are consistent with end stage degenerative joint disease of the right knee(s) and total knee arthroplasty is deemed medically necessary. The treatment options including medical management, injection therapy arthroscopy and arthroplasty were discussed at  length. The risks and benefits of total knee arthroplasty were presented and reviewed. The risks due to aseptic loosening, infection, stiffness, patella tracking problems, thromboembolic complications and other imponderables were discussed. The patient acknowledged the explanation, agreed to proceed with the plan and consent was signed. Patient is being admitted for inpatient treatment  for surgery, pain control, PT, OT, prophylactic antibiotics, VTE prophylaxis, progressive ambulation and ADL's and discharge planning. The patient is planning to be discharged home with home health services.  Plan is for a Right Total Knee Replacement by Dr. Lequita Halt.  PCP - Dr. Marinda Elk - The patient has been seen preoperatively and felt to be stable for surgery.  The patient WILL receive TXA (tranexamic acid) prior to surgery.

## 2013-07-21 NOTE — Anesthesia Preprocedure Evaluation (Signed)
Anesthesia Evaluation  Patient identified by MRN, date of birth, ID band Patient awake    Reviewed: Allergy & Precautions, H&P , NPO status , Patient's Chart, lab work & pertinent test results  Airway       Dental  (+) Dental Advisory Given   Pulmonary former smoker,          Cardiovascular negative cardio ROS      Neuro/Psych negative neurological ROS  negative psych ROS   GI/Hepatic negative GI ROS, Neg liver ROS,   Endo/Other  negative endocrine ROS  Renal/GU negative Renal ROS     Musculoskeletal negative musculoskeletal ROS (+)   Abdominal   Peds  Hematology negative hematology ROS (+)   Anesthesia Other Findings   Reproductive/Obstetrics                           Anesthesia Physical Anesthesia Plan  ASA: I  Anesthesia Plan: Spinal   Post-op Pain Management:    Induction:   Airway Management Planned:   Additional Equipment:   Intra-op Plan:   Post-operative Plan:   Informed Consent: I have reviewed the patients History and Physical, chart, labs and discussed the procedure including the risks, benefits and alternatives for the proposed anesthesia with the patient or authorized representative who has indicated his/her understanding and acceptance.   Dental advisory given  Plan Discussed with: CRNA  Anesthesia Plan Comments:         Anesthesia Quick Evaluation

## 2013-07-21 NOTE — Evaluation (Signed)
Physical Therapy Evaluation Patient Details Name: Scott Salas MRN: [id number] DOB: 09-Feb-1951 Today's Date: 07/21/2013 Time: 4098-1191 PT Time Calculation (min): 23 min  PT Assessment / Plan / Recommendation History of Present Illness  s/p R TKA  Clinical Impression  Pt will benefit from PT to address deficits below; He plans to borrow DME;     PT Assessment  Patient needs continued PT services    Follow Up Recommendations  Home health PT    Does the patient have the potential to tolerate intense rehabilitation      Barriers to Discharge        Equipment Recommendations   (plans to borrow)    Recommendations for Other Services     Frequency 7X/week    Precautions / Restrictions Precautions Precautions: Knee Precaution Comments: I SLR today Required Braces or Orthoses: Knee Immobilizer - Right Knee Immobilizer - Right: Discontinue once straight leg raise with < 10 degree lag Restrictions RLE Weight Bearing: Weight bearing as tolerated Other Position/Activity Restrictions: WBAT   Pertinent Vitals/Pain No c/o      Mobility  Bed Mobility Bed Mobility: Supine to Sit Supine to Sit: 4: Min guard;5: Supervision Details for Bed Mobility Assistance: cues for self assist, technique Transfers Transfers: Sit to Stand;Stand to Sit Sit to Stand: 4: Min guard Stand to Sit: 4: Min guard Details for Transfer Assistance: cues for hand placement and RLE position Ambulation/Gait Ambulation/Gait Assistance: 4: Min guard Ambulation Distance (Feet): 140 Feet Assistive device: Rolling walker Ambulation/Gait Assistance Details: cues for sequence, heel to toe Gait Pattern: Step-to pattern;Step-through pattern    Exercises Total Joint Exercises Ankle Circles/Pumps: AROM;Both;10 reps Quad Sets: 5 reps;Both;AROM Straight Leg Raises: AROM;Right;5 reps   PT Diagnosis: Difficulty walking  PT Problem List: Decreased strength;Decreased range of motion;Decreased activity  tolerance;Decreased balance;Decreased mobility;Decreased knowledge of use of DME PT Treatment Interventions: Functional mobility training;Gait training;DME instruction;Stair training;Therapeutic activities;Therapeutic exercise;Patient/family education     PT Goals(Current goals can be found in the care plan section) Acute Rehab PT Goals Patient Stated Goal: I  PT Goal Formulation: With patient Time For Goal Achievement: 07/28/13 Potential to Achieve Goals: Good  Visit Information  Last PT Received On: 07/21/13 Assistance Needed: +1 History of Present Illness: s/p R TKA       Prior Functioning  Home Living Family/patient expects to be discharged to:: Private residence Living Arrangements: Spouse/significant other Available Help at Discharge: Family Type of Home: House Home Access: Stairs to enter Secretary/administrator of Steps: 5 Home Layout: Two level Alternate Level Stairs-Number of Steps: 5; pt will be able to stay on this level Additional Comments: can borrow walker, crutches Prior Function Level of Independence: Independent Communication Communication: No difficulties    Cognition  Cognition Arousal/Alertness: Awake/alert Behavior During Therapy: WFL for tasks assessed/performed Overall Cognitive Status: Within Functional Limits for tasks assessed    Extremity/Trunk Assessment Upper Extremity Assessment Upper Extremity Assessment: Overall WFL for tasks assessed Lower Extremity Assessment Lower Extremity Assessment: RLE deficits/detail RLE Deficits / Details: I SLR   Balance    End of Session PT - End of Session Equipment Utilized During Treatment: Gait belt Activity Tolerance: Patient tolerated treatment well Patient left: with call bell/phone within reach;in chair Nurse Communication: Mobility status CPM Right Knee CPM Right Knee: Off  GP     [facility] 07/21/2013, 5:18 PM

## 2013-07-22 LAB — BASIC METABOLIC PANEL
Chloride: 99 mEq/L (ref 96–112)
GFR calc Af Amer: >90 mL/min (ref >90)
GFR calc non Af Amer: >90 mL/min (ref >90)
Potassium: 4.4 mEq/L (ref 3.5–5.1)
Sodium: 133 mEq/L — ABNORMAL LOW (ref 135–145)

## 2013-07-22 LAB — CBC
MCH: 33.4 pg (ref 26.0–34.0)
MCHC: 35.6 g/dL (ref 30.0–36.0)
MCV: 93.9 fL (ref 78.0–100.0)
Platelets: 301 10*3/uL (ref 150–400)
RDW: 12.1 % (ref 11.5–15.5)
WBC: 17.3 10*3/uL — ABNORMAL HIGH (ref 4.0–10.5)

## 2013-07-22 MED ORDER — METHOCARBAMOL 500 MG PO TABS: 500.0000 mg | ORAL_TABLET | Freq: Four times a day (QID) | ORAL | Status: DC | PRN

## 2013-07-22 MED ORDER — OXYCODONE HCL 5 MG PO TABS: 5.0000 mg | ORAL_TABLET | ORAL | Status: DC | PRN

## 2013-07-22 MED ORDER — RIVAROXABAN 10 MG PO TABS: 10.0000 mg | ORAL_TABLET | Freq: Every day | ORAL | Status: DC

## 2013-07-22 NOTE — Progress Notes (Signed)
Physical Therapy Treatment Patient Details Name: Scott Salas MRN: [id number] DOB: [birth year] Today's Date: 07/22/2013 Time: 1017-1040 PT Time Calculation (min): 23 min  PT Assessment / Plan / Recommendation  History of Present Illness s/p R TKA   PT Comments   Progressing well with mobility. Plan is for d/c home later today. Will see for a 2nd session to walk again. Pt performed exercises and practiced steps this am. Recommend HHPT. Pt states he has access to DME if needed.   Follow Up Recommendations  Home health PT     Does the patient have the potential to tolerate intense rehabilitation     Barriers to Discharge        Equipment Recommendations       Recommendations for Other Services    Frequency 7X/week   Progress towards PT Goals Progress towards PT goals: Progressing toward goals  Plan Current plan remains appropriate    Precautions / Restrictions Precautions Precautions: Knee Required Braces or Orthoses: Knee Immobilizer - Right Knee Immobilizer - Right: Discontinue once straight leg raise with < 10 degree lag (DC'd 10/6-able to SLR) Restrictions Weight Bearing Restrictions: No RLE Weight Bearing: Weight bearing as tolerated   Pertinent Vitals/Pain R knee with exercises 5/10. Ice applied end of session    Mobility  Bed Mobility Bed Mobility: Not assessed Supine to Sit: HOB flat;5: Supervision Details for Bed Mobility Assistance: pt moves quickly Transfers Transfers: Sit to Stand;Stand to Sit Sit to Stand: 6: Modified independent (Device/Increase time);From chair/3-in-1 Stand to Sit: 5: Supervision;To chair/3-in-1 Details for Transfer Assistance: VCs safety, hand placement Ambulation/Gait Ambulation/Gait Assistance: 5: Supervision Ambulation Distance (Feet): 150 Feet Assistive device: Rolling walker Gait Pattern: Step-to pattern;Step-through pattern;Antalgic;Right flexed knee in stance Stairs: Yes Stairs Assistance: 4: Min guard Stairs Assistance  Details (indicate cue type and reason): VCs safety, technique, sequence.  Stair Management Technique: One rail Left;Forwards;With crutches Number of Stairs: 4    Exercises Total Joint Exercises Ankle Circles/Pumps: AROM;Both;15 reps;Seated Quad Sets: AROM;Both;15 reps;Seated Hip ABduction/ADduction: AROM;Right;15 reps;Seated Straight Leg Raises: AROM;Right;15 reps;Seated Knee Flexion: AAROM;Right;5 reps;Seated Goniometric ROM: 10-90 degrees   PT Diagnosis:    PT Problem List:   PT Treatment Interventions:     PT Goals (current goals can now be found in the care plan section) Acute Rehab PT Goals Patient Stated Goal: home   Visit Information  Last PT Received On: 07/22/13 Assistance Needed: +1 History of Present Illness: s/p R TKA    Subjective Data  Patient Stated Goal: home    Cognition  Cognition Arousal/Alertness: Awake/alert Behavior During Therapy: WFL for tasks assessed/performed Overall Cognitive Status: Within Functional Limits for tasks assessed    Balance     End of Session PT - End of Session Equipment Utilized During Treatment: Gait belt Activity Tolerance: Patient tolerated treatment well Patient left: in chair;with call bell/phone within reach   GP     Rebeca Alert, MPT Pager: [phone]

## 2013-07-22 NOTE — Evaluation (Signed)
Occupational Therapy Evaluation Patient Details Name: Scott Salas MRN: [id number] DOB: 18-Apr-1951 Today's Date: 07/22/2013 Time: 5621-3086 OT Time Calculation (min): 20 min  OT Assessment / Plan / Recommendation History of present illness s/p R TKA   Clinical Impression   Pt was admitted for R TKA.  All education was completed but will follow pt in acute setting for increased safety as pt tends to move quickly.      OT Assessment  Patient needs continued OT Services    Follow Up Recommendations  No OT follow up    Barriers to Discharge      Equipment Recommendations  None recommended by OT (has 3:1; may get shower seat due to space in shower)    Recommendations for Other Services    Frequency  Min 2X/week    Precautions / Restrictions Precautions Precautions: Knee Restrictions Weight Bearing Restrictions: Yes RLE Weight Bearing: Weight bearing as tolerated   Pertinent Vitals/Pain 2/10 R knee; reapplied ice and repositioned.  Sats 100% on RA    ADL  Grooming: Supervision/safety Where Assessed - Grooming: Supported standing Toilet Transfer: Hydrographic surveyor Method: Sit to Barista: Raised toilet seat with arms (or 3-in-1 over toilet) Tub/Shower Transfer: Insurance risk surveyor Method: Science writer: Walk in Scientist, research (physical sciences) Used: Rolling walker Transfers/Ambulation Related to ADLs: ambulated to bathroom.  Pt needs min cues for safety as he tends to move quickly.   ADL Comments: Stood to use urinal with min guard, standing against chair.  Min A for LB ADLs, simulated today--wife will assist    OT Diagnosis:    OT Problem List: Decreased safety awareness OT Treatment Interventions: Self-care/ADL training;DME and/or AE instruction;Patient/family education   OT Goals(Current goals can be found in the care plan section) Acute Rehab OT Goals Patient Stated Goal: home  OT Goal Formulation: With  patient Time For Goal Achievement: 07/29/13 Potential to Achieve Goals: Good ADL Goals Additional ADL Goal #1: Pt will not need any safety cues for toilet and shower transfers given supervision  Visit Information  Last OT Received On: 07/22/13 Assistance Needed: +1 History of Present Illness: s/p R TKA       Prior Functioning     Home Living Family/patient expects to be discharged to:: Private residence Living Arrangements: Spouse/significant other Available Help at Discharge: Family Type of Home: House Home Access: Stairs to enter Secretary/administrator of Steps: 5 Home Equipment: Bedside commode Prior Function Level of Independence: Independent Communication Communication: No difficulties         Vision/Perception     Cognition  Cognition Arousal/Alertness: Awake/alert Behavior During Therapy: WFL for tasks assessed/performed Overall Cognitive Status: Within Functional Limits for tasks assessed    Extremity/Trunk Assessment Upper Extremity Assessment Upper Extremity Assessment: Overall WFL for tasks assessed     Mobility Bed Mobility Bed Mobility: Supine to Sit Supine to Sit: HOB flat;5: Supervision Details for Bed Mobility Assistance: pt moves quickly Transfers Sit to Stand: 5: Supervision;From bed;From chair/3-in-1 Details for Transfer Assistance: cues for hand placement     Exercise     Balance     End of Session OT - End of Session Activity Tolerance: Patient tolerated treatment well Patient left: in chair;with call bell/phone within reach;with family/visitor present  GO     Magdelene Ruark 07/22/2013, 9:23 AM Marica Otter, OTR/L [phone] 07/22/2013

## 2013-07-22 NOTE — Progress Notes (Signed)
Physical Therapy Treatment Patient Details Name: Scott Salas MRN: [id number] DOB: Jul 15, 1951 Today's Date: 07/22/2013 Time: 4098-1191 PT Time Calculation (min): 8 min  PT Assessment / Plan / Recommendation  History of Present Illness s/p R TKA   PT Comments   2nd session. Pt continues to perform well. Denies pain with ambulation but rates it 5/10 with ROM (bending of knee). All education completed. No further questions from pt. Ready to d/c from PT standpoint.   Follow Up Recommendations  Home health PT     Does the patient have the potential to tolerate intense rehabilitation     Barriers to Discharge        Equipment Recommendations       Recommendations for Other Services    Frequency 7X/week   Progress towards PT Goals Progress towards PT goals: Progressing toward goals  Plan Current plan remains appropriate    Precautions / Restrictions Precautions Precautions: Knee Required Braces or Orthoses: Knee Immobilizer - Right Knee Immobilizer - Right: Discontinue once straight leg raise with < 10 degree lag (KI DC'd 10/6) Restrictions Weight Bearing Restrictions: No RLE Weight Bearing: Weight bearing as tolerated   Pertinent Vitals/Pain 0/10 pain with ambulation "knee still feels numb"; 5/10 with ROM exercises. Ice applied end of session    Mobility  Transfers Transfers: Sit to Stand;Stand to Sit Sit to Stand: 6: Modified independent (Device/Increase time);From chair/3-in-1 Stand to Sit: 6: Modified independent (Device/Increase time);To chair/3-in-1 Details for Transfer Assistance: VCs safety, hand placement Ambulation/Gait Ambulation/Gait Assistance: 6: Modified independent (Device/Increase time) Ambulation Distance (Feet): 160 Feet Assistive device: Rolling walker Ambulation/Gait Assistance Details: Pt denies pain with ambulation.  Gait Pattern: Step-through pattern Stairs: No-practiced earlier this am   Exercises    PT Diagnosis:    PT Problem List:   PT  Treatment Interventions:     PT Goals (current goals can now be found in the care plan section) Acute Rehab PT Goals Patient Stated Goal: home   Visit Information  Last PT Received On: 07/22/13 Assistance Needed: +1 History of Present Illness: s/p R TKA    Subjective Data  Patient Stated Goal: home    Cognition  Cognition Arousal/Alertness: Awake/alert Behavior During Therapy: WFL for tasks assessed/performed Overall Cognitive Status: Within Functional Limits for tasks assessed    Balance     End of Session PT - End of Session Equipment Utilized During Treatment: Gait belt Activity Tolerance: Patient tolerated treatment well Patient left: in chair;with call bell/phone within reach   GP     Rebeca Alert, MPT Pager: [phone]

## 2013-07-22 NOTE — Progress Notes (Signed)
   Subjective: 1 Day Post-Op Procedure(s) (LRB): RIGHT TOTAL KNEE ARTHROPLASTY (Right) Patient reports pain as mild.   Patient seen in rounds for Dr. Lequita Halt. Patient is well, and has had no acute complaints or problems Patient may be ready to go home later today if meets all goals.  Objective: Vital signs in last 24 hours: Temp:  [97.5 F (36.4 C)-98.9 F (37.2 C)] 97.5 F (36.4 C) (10/07 7829) Pulse Rate:  [55-66] 60 (10/07 0638) Resp:  [12-18] 14 (10/07 0638) BP: (106-132)/(56-89) 113/72 mmHg (10/07 0638) SpO2:  [95 %-100 %] 95 % (10/07 0638) FiO2 (%):  [100 %] 100 % (10/06 1145) Weight:  [86.637 kg (191 lb)] 86.637 kg (191 lb) (10/06 1145)  Intake/Output from previous day:  Intake/Output Summary (Last 24 hours) at 07/22/13 0958 Last data filed at 07/22/13 0800  Gross per 24 hour  Intake 2376.67 ml  Output   4241 ml  Net -1864.33 ml    Intake/Output this shift: Total I/O In: 100 [P.O.:100] Out: -   Labs:  Recent Labs  07/22/13 0430  HGB 12.6*    Recent Labs  07/22/13 0430  WBC 17.3*  RBC 3.77*  HCT 35.4*  PLT 301    Recent Labs  07/22/13 0430  NA 133*  K 4.4  CL 99  CO2 23  BUN 12  CREATININE 0.61  GLUCOSE 164*  CALCIUM 9.3   No results found for this basename: LABPT, INR,  in the last 72 hours  EXAM: General - Patient is Alert, Appropriate and Oriented Extremity - Neurovascular intact Sensation intact distally Dorsiflexion/Plantar flexion intact Dressing - clean, dry, no drainage Motor Function - intact, moving foot and toes well on exam.   Assessment/Plan: 1 Day Post-Op Procedure(s) (LRB): RIGHT TOTAL KNEE ARTHROPLASTY (Right) Procedure(s) (LRB): RIGHT TOTAL KNEE ARTHROPLASTY (Right) Past Medical History  Diagnosis Date  . Arthritis    Principal Problem:   OA (osteoarthritis) of knee  Estimated body mass index is 28.19 kg/(m^2) as calculated from the following:   Height as of this encounter: 5\' 9"  (1.753 m).   Weight as of  this encounter: 86.637 kg (191 lb). Advance diet Up with therapy Discharge home with home health Diet - Regular diet Follow up - in 2 weeks Activity - WBAT Disposition - Home Condition Upon Discharge - Good D/C Meds - See DC Summary DVT Prophylaxis - Xarelto  [place] 07/22/2013, 9:58 AM

## 2013-07-22 NOTE — Care Management Note (Signed)
    Page 1 of 1   07/22/2013     3:44:53 PM   CARE MANAGEMENT NOTE 07/22/2013  Patient:  Scott Salas, Scott Salas   Account Number:  [id number]  Date Initiated:  07/22/2013  Documentation initiated by:  Colleen Can  Subjective/Objective Assessment:   dx OA right knee; total knee replacemnt     Action/Plan:   CM spoke with patient. Plans are for patient to return to his home in [place] where spouse will be caregiver. He already has DME-RW, commode seat and crutches> Genevieve Norlander will provide [facility] services.   Anticipated DC Date:  07/22/2013   Anticipated DC Plan:  HOME W HOME HEALTH SERVICES      DC Planning Services  CM consult      [facility] Choice  HOME HEALTH   Choice offered to / List presented to:  C-1 Patient        HH arranged  HH-2 PT      Status of service:  Completed, signed off Medicare Important Message given?  NA - LOS <3 / Initial given by admissions (If response is "NO", the following Medicare IM given date fields will be blank) Date Medicare IM given:   Date Additional Medicare IM given:    Discharge Disposition:  HOME W HOME HEALTH SERVICES  Per UR Regulation:    If discussed at Long Length of Stay Meetings, dates discussed:    Comments:  07/22/2013 Colleen Can BSN RN CCM [phone] ]Pt discharged to home with [facility] services to start tomorrow 10/0-05/2013.

## 2013-07-22 NOTE — Discharge Summary (Signed)
Physician Discharge Summary   Patient ID: Scott Salas MRN: [id number] DOB/AGE: [birth year] 60 y.o.  Admit date: 07/21/2013 Discharge date: 07/22/2013  Primary Diagnosis:  Osteoarthritis Right knee(s)  Admission Diagnoses:  Past Medical History  Diagnosis Date  . Arthritis    Discharge Diagnoses:   Principal Problem:   OA (osteoarthritis) of knee Active Problems:   Hyponatremia  Estimated body mass index is 28.19 kg/(m^2) as calculated from the following:   Height as of this encounter: 5\' 9"  (1.753 m).   Weight as of this encounter: 86.637 kg (191 lb).  Procedure:  Procedure(s) (LRB): RIGHT TOTAL KNEE ARTHROPLASTY (Right)   Consults: None  HPI: Scott Salas is a 62 y.o. year old male with end stage OA of his right knee with progressively worsening pain and dysfunction. He has constant pain, with activity and at rest and significant functional deficits with difficulties even with ADLs. He has had extensive non-op management including analgesics, injections of cortisone, and home exercise program, but remains in significant pain with significant dysfunction. Radiographs show bone on bone arthritis medial and patellofemoral. He presents now for rightt Total Knee Arthroplasty.  Laboratory Data: Admission on 07/21/2013, Discharged on 07/22/2013  Component Date Value Range Status  . ABO/RH(D) 07/21/2013 O POS   Final  . Antibody Screen 07/21/2013 NEG   Final  . Sample Expiration 07/21/2013 07/24/2013   Final  . ABO/RH(D) 07/21/2013 O POS   Final  . WBC 07/22/2013 17.3* 4.0 - 10.5 K/uL Final  . RBC 07/22/2013 3.77* 4.22 - 5.81 MIL/uL Final  . Hemoglobin 07/22/2013 12.6* 13.0 - 17.0 g/dL Final  . HCT 40/98/1191 35.4* 39.0 - 52.0 % Final  . MCV 07/22/2013 93.9  78.0 - 100.0 fL Final  . MCH 07/22/2013 33.4  26.0 - 34.0 pg Final  . MCHC 07/22/2013 35.6  30.0 - 36.0 g/dL Final  . RDW 47/82/9562 12.1  11.5 - 15.5 % Final  . Platelets 07/22/2013 301  150 - 400 K/uL Final  .  Sodium 07/22/2013 133* 135 - 145 mEq/L Final  . Potassium 07/22/2013 4.4  3.5 - 5.1 mEq/L Final  . Chloride 07/22/2013 99  96 - 112 mEq/L Final  . CO2 07/22/2013 23  19 - 32 mEq/L Final  . Glucose, Bld 07/22/2013 164* 70 - 99 mg/dL Final  . BUN 13/05/6577 12  6 - 23 mg/dL Final  . Creatinine, Ser 07/22/2013 0.61  0.50 - 1.35 mg/dL Final  . Calcium 46/96/2952 9.3  8.4 - 10.5 mg/dL Final  . GFR calc non Af Amer 07/22/2013 >90  >90 mL/min Final  . GFR calc Af Amer 07/22/2013 >90  >90 mL/min Final   Comment: (NOTE)                          The eGFR has been calculated using the CKD EPI equation.                          This calculation has not been validated in all clinical situations.                          eGFR's persistently <90 mL/min signify possible Chronic Kidney                          Disease.  Hospital Outpatient Visit on 07/16/2013  Component Date Value Range  Status  . MRSA, PCR 07/16/2013 NEGATIVE  NEGATIVE Final  . Staphylococcus aureus 07/16/2013 NEGATIVE  NEGATIVE Final   Comment:                                 The Xpert SA Assay (FDA                          approved for NASAL specimens                          in patients over 60 years of age),                          is one component of                          a comprehensive surveillance                          program.  Test performance has                          been validated by [employer] for patients greater                          than or equal to 71 year old.                          It is not intended                          to diagnose infection nor to                          guide or monitor treatment.                          Performed at [facility]  . aPTT 07/16/2013 26  24 - 37 seconds Final  . WBC 07/16/2013 6.8  4.0 - 10.5 K/uL Final  . RBC 07/16/2013 4.38  4.22 - 5.81 MIL/uL Final  . Hemoglobin 07/16/2013 14.2  13.0 - 17.0 g/dL Final  . HCT 96/01/5408  41.8  39.0 - 52.0 % Final  . MCV 07/16/2013 95.4  78.0 - 100.0 fL Final  . MCH 07/16/2013 32.4  26.0 - 34.0 pg Final  . MCHC 07/16/2013 34.0  30.0 - 36.0 g/dL Final  . RDW 81/19/1478 12.2  11.5 - 15.5 % Final  . Platelets 07/16/2013 286  150 - 400 K/uL Final  . Sodium 07/16/2013 139  135 - 145 mEq/L Final  . Potassium 07/16/2013 4.9  3.5 - 5.1 mEq/L Final  . Chloride 07/16/2013 101  96 - 112 mEq/L Final  . CO2 07/16/2013 29  19 - 32 mEq/L Final  . Glucose, Bld 07/16/2013 94  70 - 99 mg/dL Final  . BUN 29/56/2130 17  6 - 23 mg/dL Final  . Creatinine, Ser 07/16/2013 0.98  0.50 - 1.35 mg/dL Final  . Calcium 19/14/7829 9.8  8.4 - 10.5 mg/dL Final  . Total Protein 07/16/2013 7.4  6.0 - 8.3 g/dL Final  . Albumin 56/21/3086 3.6  3.5 - 5.2 g/dL Final  . AST 57/84/6962 23  0 - 37 U/L Final  . ALT 07/16/2013 23  0 - 53 U/L Final  . Alkaline Phosphatase 07/16/2013 67  39 - 117 U/L Final  . Total Bilirubin 07/16/2013 0.4  0.3 - 1.2 mg/dL Final  . GFR calc non Af Amer 07/16/2013 88* >90 mL/min Final  . GFR calc Af Amer 07/16/2013 >90  >90 mL/min Final   Comment: (NOTE)                          The eGFR has been calculated using the CKD EPI equation.                          This calculation has not been validated in all clinical situations.                          eGFR's persistently <90 mL/min signify possible Chronic Kidney                          Disease.  Marland Kitchen Prothrombin Time 07/16/2013 12.8  11.6 - 15.2 seconds Final  . INR 07/16/2013 0.98  0.00 - 1.49 Final  . Color, Urine 07/16/2013 YELLOW  YELLOW Final  . APPearance 07/16/2013 CLEAR  CLEAR Final  . Specific Gravity, Urine 07/16/2013 1.024  1.005 - 1.030 Final  . pH 07/16/2013 6.0  5.0 - 8.0 Final  . Glucose, UA 07/16/2013 NEGATIVE  NEGATIVE mg/dL Final  . Hgb urine dipstick 07/16/2013 NEGATIVE  NEGATIVE Final  . Bilirubin Urine 07/16/2013 NEGATIVE  NEGATIVE Final  . Ketones, ur 07/16/2013 NEGATIVE  NEGATIVE mg/dL Final  . Protein, ur  95/28/4132 NEGATIVE  NEGATIVE mg/dL Final  . Urobilinogen, UA 07/16/2013 0.2  0.0 - 1.0 mg/dL Final  . Nitrite 44/10/270 NEGATIVE  NEGATIVE Final  . Leukocytes, UA 07/16/2013 NEGATIVE  NEGATIVE Final   MICROSCOPIC NOT DONE ON URINES WITH NEGATIVE PROTEIN, BLOOD, LEUKOCYTES, NITRITE, OR GLUCOSE <1000 mg/dL.     X-Rays:No results found.  EKG:No orders found for this or any previous visit.   Hospital Course: Scott Salas is a 62 y.o. who was admitted to [facility] They were brought to the operating room on 07/21/2013 and underwent Procedure(s): RIGHT TOTAL KNEE ARTHROPLASTY.  Patient tolerated the procedure well and was later transferred to the recovery room and then to the orthopaedic floor for postoperative care.  They were given PO and IV analgesics for pain control following their surgery.  They were given 24 hours of postoperative antibiotics of  Anti-infectives   Start     Dose/Rate Route Frequency Ordered Stop   07/21/13 1600  ceFAZolin (ANCEF) IVPB 1 g/50 mL premix     1 g 100 mL/hr over 30 Minutes Intravenous Every 6 hours 07/21/13 1147 07/22/13 0447   07/21/13 0630  ceFAZolin (ANCEF) IVPB 2 g/50 mL premix     2 g 100 mL/hr over 30 Minutes Intravenous On call to O.R. 07/21/13 5366 07/21/13 0926     and started on DVT prophylaxis in the form of Xarelto.   PT and OT were ordered for total joint protocol.  Discharge planning consulted to help with postop disposition and equipment needs.  Patient had a good night on the evening of surgery.  They started to get up OOB with therapy on day one. Hemovac drain was pulled without difficulty. Patient was seen in rounds and was ready to go home later that same day after meeting goals.   Discharge Medications: Prior to Admission medications   Medication Sig Start Date End Date Taking? Authorizing Provider  fluticasone (FLONASE) 50 MCG/ACT nasal spray Place 1 spray into the nose daily as needed.     Historical Provider, MD    methocarbamol (ROBAXIN) 500 MG tablet Take 1 tablet (500 mg total) by mouth every 6 (six) hours as needed. 07/22/13   Trevonn Hallum, PA-C  oxyCODONE (OXY IR/ROXICODONE) 5 MG immediate release tablet Take 1-2 tablets (5-10 mg total) by mouth every 3 (three) hours as needed. 07/22/13   Cordie Buening Julien Girt, PA-C  rivaroxaban (XARELTO) 10 MG TABS tablet Take 1 tablet (10 mg total) by mouth daily with breakfast. 07/22/13   Cornelius Schuitema Julien Girt, PA-C  traMADol (ULTRAM) 50 MG tablet Take 50 mg by mouth every 6 (six) hours as needed for pain.   Yes Historical Provider, MD   Discharge home with home health  Diet - Regular diet  Follow up - in 2 weeks  Activity - WBAT  Disposition - Home  Condition Upon Discharge - Good  D/C Meds - See DC Summary  DVT Prophylaxis - Xarelto       Discharge Orders   Future Orders Complete By Expires   Call MD / Call 911  As directed    Comments:     If you experience chest pain or shortness of breath, CALL 911 and be transported to the hospital emergency room.  If you develope a fever above 101 F, pus (white drainage) or increased drainage or redness at the wound, or calf pain, call your surgeon's office.   Change dressing  As directed    Comments:     Change dressing daily with sterile 4 x 4 inch gauze dressing and apply TED hose. Do not submerge the incision under water.   Constipation Prevention  As directed    Comments:     Drink plenty of fluids.  Prune juice may be helpful.  You may use a stool softener, such as Colace (over the counter) 100 mg twice a day.  Use MiraLax (over the counter) for constipation as needed.   Diet general  As directed    Discharge instructions  As directed    Comments:     Pick up stool softner and laxative for home. Do not submerge incision under water. May shower. Continue to use ice for pain and swelling from surgery. Hip precautions.  Total Hip Protocol.  Take Xarelto for two and a half more weeks, then discontinue  Xarelto.   Do not put a pillow under the knee. Place it under the heel.  As directed    Do not sit on low chairs, stoools or toilet seats, as it may be difficult to get up from low surfaces  As directed    Driving restrictions  As directed    Comments:     No driving until released by the physician.   Increase activity slowly as tolerated  As directed    Lifting restrictions  As directed    Comments:     No lifting until released by the physician.   Patient may shower  As directed  Comments:     You may shower without a dressing once there is no drainage.  Do not wash over the wound.  If drainage remains, do not shower until drainage stops.   TED hose  As directed    Comments:     Use stockings (TED hose) for 3 weeks on both leg(s).  You may remove them at night for sleeping.   Weight bearing as tolerated  As directed    Questions:     Laterality:     Extremity:         Medication List    STOP taking these medications       aspirin EC 81 MG tablet     fish oil-omega-3 fatty acids 1000 MG capsule     multivitamin with minerals Tabs tablet      TAKE these medications       fluticasone 50 MCG/ACT nasal spray  Commonly known as:  FLONASE  Place 1 spray into the nose daily as needed.     methocarbamol 500 MG tablet  Commonly known as:  ROBAXIN  Take 1 tablet (500 mg total) by mouth every 6 (six) hours as needed.     oxyCODONE 5 MG immediate release tablet  Commonly known as:  Oxy IR/ROXICODONE  Take 1-2 tablets (5-10 mg total) by mouth every 3 (three) hours as needed.     rivaroxaban 10 MG Tabs tablet  Commonly known as:  XARELTO  Take 1 tablet (10 mg total) by mouth daily with breakfast.     traMADol 50 MG tablet  Commonly known as:  ULTRAM  Take 50 mg by mouth every 6 (six) hours as needed for pain.       Follow-up Information   Follow up with Loanne Drilling, MD. Schedule an appointment as soon as possible for a visit on 08/05/2013.   Specialty:  Orthopedic  Surgery   Contact information:   [address] [place] [address] [phone]       Signed: Patrica Duel 08/01/2013, 9:17 AM

## 2013-08-01 DIAGNOSIS — E871 Hypo-osmolality and hyponatremia: Secondary | ICD-10-CM

## 2017-12-25 DIAGNOSIS — Z86010 Personal history of colonic polyps: Secondary | ICD-10-CM | POA: Diagnosis not present

## 2017-12-25 DIAGNOSIS — K640 First degree hemorrhoids: Secondary | ICD-10-CM | POA: Diagnosis not present

## 2017-12-25 DIAGNOSIS — K5730 Diverticulosis of large intestine without perforation or abscess without bleeding: Secondary | ICD-10-CM | POA: Diagnosis not present

## 2017-12-25 DIAGNOSIS — D126 Benign neoplasm of colon, unspecified: Secondary | ICD-10-CM | POA: Diagnosis not present

## 2017-12-28 DIAGNOSIS — D126 Benign neoplasm of colon, unspecified: Secondary | ICD-10-CM | POA: Diagnosis not present

## 2018-05-08 DIAGNOSIS — R7303 Prediabetes: Secondary | ICD-10-CM | POA: Diagnosis not present

## 2018-05-08 DIAGNOSIS — Z0000 Encounter for general adult medical examination without abnormal findings: Secondary | ICD-10-CM | POA: Diagnosis not present

## 2018-05-08 DIAGNOSIS — Z125 Encounter for screening for malignant neoplasm of prostate: Secondary | ICD-10-CM | POA: Diagnosis not present

## 2018-05-08 DIAGNOSIS — E7800 Pure hypercholesterolemia, unspecified: Secondary | ICD-10-CM | POA: Diagnosis not present

## 2018-05-08 DIAGNOSIS — Z23 Encounter for immunization: Secondary | ICD-10-CM | POA: Diagnosis not present

## 2018-05-08 DIAGNOSIS — E663 Overweight: Secondary | ICD-10-CM | POA: Diagnosis not present

## 2018-05-08 DIAGNOSIS — Z96651 Presence of right artificial knee joint: Secondary | ICD-10-CM | POA: Diagnosis not present

## 2018-05-08 DIAGNOSIS — Z86010 Personal history of colonic polyps: Secondary | ICD-10-CM | POA: Diagnosis not present

## 2018-05-08 DIAGNOSIS — N529 Male erectile dysfunction, unspecified: Secondary | ICD-10-CM | POA: Diagnosis not present

## 2018-05-20 DIAGNOSIS — Z96651 Presence of right artificial knee joint: Secondary | ICD-10-CM | POA: Diagnosis not present

## 2018-05-20 DIAGNOSIS — M1711 Unilateral primary osteoarthritis, right knee: Secondary | ICD-10-CM | POA: Diagnosis not present

## 2018-05-20 DIAGNOSIS — Z471 Aftercare following joint replacement surgery: Secondary | ICD-10-CM | POA: Diagnosis not present

## 2018-05-30 DIAGNOSIS — M25561 Pain in right knee: Secondary | ICD-10-CM | POA: Diagnosis not present

## 2018-05-30 DIAGNOSIS — Z96651 Presence of right artificial knee joint: Secondary | ICD-10-CM | POA: Diagnosis not present

## 2018-06-03 DIAGNOSIS — M25561 Pain in right knee: Secondary | ICD-10-CM | POA: Diagnosis not present

## 2018-06-03 DIAGNOSIS — Z96651 Presence of right artificial knee joint: Secondary | ICD-10-CM | POA: Diagnosis not present

## 2018-06-05 DIAGNOSIS — Z96651 Presence of right artificial knee joint: Secondary | ICD-10-CM | POA: Diagnosis not present

## 2018-06-05 DIAGNOSIS — M25561 Pain in right knee: Secondary | ICD-10-CM | POA: Diagnosis not present

## 2018-06-11 DIAGNOSIS — Z96651 Presence of right artificial knee joint: Secondary | ICD-10-CM | POA: Diagnosis not present

## 2018-06-11 DIAGNOSIS — M25561 Pain in right knee: Secondary | ICD-10-CM | POA: Diagnosis not present

## 2018-06-13 DIAGNOSIS — M25561 Pain in right knee: Secondary | ICD-10-CM | POA: Diagnosis not present

## 2018-06-13 DIAGNOSIS — Z96651 Presence of right artificial knee joint: Secondary | ICD-10-CM | POA: Diagnosis not present

## 2018-06-20 DIAGNOSIS — M25561 Pain in right knee: Secondary | ICD-10-CM | POA: Diagnosis not present

## 2018-06-20 DIAGNOSIS — Z96651 Presence of right artificial knee joint: Secondary | ICD-10-CM | POA: Diagnosis not present

## 2018-10-22 DIAGNOSIS — L723 Sebaceous cyst: Secondary | ICD-10-CM | POA: Diagnosis not present

## 2018-11-26 DIAGNOSIS — L723 Sebaceous cyst: Secondary | ICD-10-CM | POA: Diagnosis not present

## 2018-12-24 DIAGNOSIS — L723 Sebaceous cyst: Secondary | ICD-10-CM | POA: Diagnosis not present

## 2019-04-14 DIAGNOSIS — Z20828 Contact with and (suspected) exposure to other viral communicable diseases: Secondary | ICD-10-CM | POA: Diagnosis not present

## 2019-04-16 DIAGNOSIS — R05 Cough: Secondary | ICD-10-CM | POA: Diagnosis not present

## 2019-05-15 DIAGNOSIS — Z96651 Presence of right artificial knee joint: Secondary | ICD-10-CM | POA: Diagnosis not present

## 2019-05-15 DIAGNOSIS — R7303 Prediabetes: Secondary | ICD-10-CM | POA: Diagnosis not present

## 2019-05-15 DIAGNOSIS — Z125 Encounter for screening for malignant neoplasm of prostate: Secondary | ICD-10-CM | POA: Diagnosis not present

## 2019-05-15 DIAGNOSIS — Z0000 Encounter for general adult medical examination without abnormal findings: Secondary | ICD-10-CM | POA: Diagnosis not present

## 2019-05-15 DIAGNOSIS — N529 Male erectile dysfunction, unspecified: Secondary | ICD-10-CM | POA: Diagnosis not present

## 2019-05-15 DIAGNOSIS — E7800 Pure hypercholesterolemia, unspecified: Secondary | ICD-10-CM | POA: Diagnosis not present

## 2019-05-15 DIAGNOSIS — Z86010 Personal history of colonic polyps: Secondary | ICD-10-CM | POA: Diagnosis not present

## 2019-06-19 DIAGNOSIS — E7800 Pure hypercholesterolemia, unspecified: Secondary | ICD-10-CM | POA: Diagnosis not present

## 2019-06-19 DIAGNOSIS — Z125 Encounter for screening for malignant neoplasm of prostate: Secondary | ICD-10-CM | POA: Diagnosis not present

## 2019-06-19 DIAGNOSIS — R7303 Prediabetes: Secondary | ICD-10-CM | POA: Diagnosis not present

## 2019-06-19 DIAGNOSIS — Z23 Encounter for immunization: Secondary | ICD-10-CM | POA: Diagnosis not present

## 2019-09-15 ENCOUNTER — Emergency Department (HOSPITAL_COMMUNITY): Payer: PPO

## 2019-09-15 ENCOUNTER — Other Ambulatory Visit: Payer: Self-pay

## 2019-09-15 ENCOUNTER — Emergency Department (HOSPITAL_COMMUNITY)
Admission: EM | Admit: 2019-09-15 | Discharge: 2019-09-15 | Disposition: A | Discharge status: Home / Self Care | Payer: PPO | Attending: Emergency Medicine | Admitting: Emergency Medicine

## 2019-09-15 DIAGNOSIS — Z96651 Presence of right artificial knee joint: Secondary | ICD-10-CM | POA: Insufficient documentation

## 2019-09-15 DIAGNOSIS — Z87891 Personal history of nicotine dependence: Secondary | ICD-10-CM | POA: Diagnosis not present

## 2019-09-15 DIAGNOSIS — E7800 Pure hypercholesterolemia, unspecified: Secondary | ICD-10-CM | POA: Diagnosis not present

## 2019-09-15 DIAGNOSIS — R0789 Other chest pain: Secondary | ICD-10-CM | POA: Insufficient documentation

## 2019-09-15 DIAGNOSIS — R0600 Dyspnea, unspecified: Secondary | ICD-10-CM | POA: Diagnosis not present

## 2019-09-15 DIAGNOSIS — Z7901 Long term (current) use of anticoagulants: Secondary | ICD-10-CM | POA: Insufficient documentation

## 2019-09-15 DIAGNOSIS — Z79899 Other long term (current) drug therapy: Secondary | ICD-10-CM | POA: Diagnosis not present

## 2019-09-15 DIAGNOSIS — E663 Overweight: Secondary | ICD-10-CM | POA: Diagnosis not present

## 2019-09-15 DIAGNOSIS — R079 Chest pain, unspecified: Secondary | ICD-10-CM | POA: Diagnosis not present

## 2019-09-15 DIAGNOSIS — R7303 Prediabetes: Secondary | ICD-10-CM | POA: Diagnosis not present

## 2019-09-15 LAB — BASIC METABOLIC PANEL
Anion gap: 12 (ref 5–15)
BUN: 15 mg/dL (ref 8–23)
CO2: 27 mmol/L (ref 22–32)
Calcium: 9.7 mg/dL (ref 8.9–10.3)
Chloride: 99 mmol/L (ref 98–111)
Creatinine, Ser: 0.81 mg/dL (ref 0.61–1.24)
GFR calc Af Amer: >60 mL/min (ref >60)
GFR calc non Af Amer: >60 mL/min (ref >60)
Glucose, Bld: 130 mg/dL — ABNORMAL HIGH (ref 70–99)
Potassium: 4.4 mmol/L (ref 3.5–5.1)
Sodium: 138 mmol/L (ref 135–145)

## 2019-09-15 LAB — CBC
HCT: 45.7 % (ref 39.0–52.0)
Hemoglobin: 15.4 g/dL (ref 13.0–17.0)
MCH: 33.3 pg (ref 26.0–34.0)
MCHC: 33.7 g/dL (ref 30.0–36.0)
MCV: 98.7 fL (ref 80.0–100.0)
Platelets: 252 10*3/uL (ref 150–400)
RBC: 4.63 MIL/uL (ref 4.22–5.81)
RDW: 11.9 % (ref 11.5–15.5)
WBC: 7.2 10*3/uL (ref 4.0–10.5)
nRBC: 0 % (ref 0.0–0.2)

## 2019-09-15 LAB — TROPONIN I (HIGH SENSITIVITY)
Troponin I (High Sensitivity): 10 ng/L (ref <18)
Troponin I (High Sensitivity): 10 ng/L (ref <18)

## 2019-09-15 MED ORDER — SODIUM CHLORIDE 0.9% FLUSH: 3.0000 mL | Freq: Once | INTRAVENOUS | Status: DC

## 2019-09-15 NOTE — ED Triage Notes (Signed)
Pt reports left sided chest pain and soreness since yesterday while walking vigorously. Reports weakness with the pain but denies nausea, vomiting, diaphoresis or radiation of pain. No prior hx of MI.

## 2019-09-15 NOTE — ED Provider Notes (Signed)
[facility] EMERGENCY DEPARTMENT Provider Note   CSN: [id number] Arrival date & time: 09/15/19  1510     History   Chief Complaint Chief Complaint  Patient presents with  . Chest Pain    HPI Scott Salas is a 68 y.o. male.     68 y.o male with a  PMH of Arthritis presents to the ED with a chief complaint of left-sided chest pain times yesterday.He [place] in [place] about 1 mile hike, the pain started after his hike, he reports a throbbing left sided chest pain with radiation down his left arm along with feeling like he is going to syncopized but did not.  Reports seen everything white with spots.Marland Kitchen He then proceeded to rest and sit down and episode resolved. He had a second episode last night in which he felt the throbbing sensation to his left chest with radiation to his left shoulder, reports he took an aspirin which may his symptoms resolved.  He does not have any prior history of CAD.  He has never seen a cardiologist, has no stress test in the past.  Does have a family history remarkable for CHF.  No fevers, shortness of breath, coughs, or other complaints.  The history is provided by the patient.    Past Medical History:  Diagnosis Date  . Arthritis     Patient Active Problem List   Diagnosis Date Noted  . Hyponatremia 08/01/2013  . OA (osteoarthritis) of knee 07/21/2013    Past Surgical History:  Procedure Laterality Date  . acl left knee     left knee  . KNEE ARTHROSCOPY     right  . TONSILLECTOMY     as child  . TOTAL KNEE ARTHROPLASTY Right 07/21/2013   Procedure: RIGHT TOTAL KNEE ARTHROPLASTY;  Surgeon: Gearlean Alf, MD;  Location: [facility];  Service: Orthopedics;  Laterality: Right;        Home Medications    Prior to Admission medications   Medication Sig Start Date End Date Taking? Authorizing Provider  fluticasone (FLONASE) 50 MCG/ACT nasal spray Place 1 spray into the nose daily as needed.     [provider]  methocarbamol (ROBAXIN) 500 MG tablet Take 1 tablet (500 mg total) by mouth every 6 (six) hours as needed. 07/22/13   Perkins, Alexzandrew L, PA-C  oxyCODONE (OXY IR/ROXICODONE) 5 MG immediate release tablet Take 1-2 tablets (5-10 mg total) by mouth every 3 (three) hours as needed. 07/22/13   Perkins, Alexzandrew L, PA-C  rivaroxaban (XARELTO) 10 MG TABS tablet Take 1 tablet (10 mg total) by mouth daily with breakfast. 07/22/13   Perkins, Alexzandrew L, PA-C  traMADol (ULTRAM) 50 MG tablet Take 50 mg by mouth every 6 (six) hours as needed for pain.    [provider]    Family History Family History  Problem Relation Age of Onset  . Heart failure Father   . Heart disease Father   . Cancer Maternal Grandfather     Social History Social History   Tobacco Use  . Smoking status: Former Research scientist (life sciences)  . Smokeless tobacco: Former Systems developer    Quit date: 07/03/1982  Substance Use Topics  . Alcohol use: Yes    Comment: occassionally  . Drug use: No     Allergies   Patient has no known allergies.   Review of Systems Review of Systems  Constitutional: Positive for diaphoresis. Negative for fever.  HENT: Negative for sore throat.   Respiratory:  Negative for shortness of breath.   Cardiovascular: Positive for chest pain.  Gastrointestinal: Negative for abdominal pain, diarrhea, nausea and vomiting.  Genitourinary: Negative for flank pain.  Musculoskeletal: Negative for back pain.  Skin: Negative for pallor and wound.  Neurological: Positive for headaches. Negative for light-headedness.     Physical Exam Updated Vital Signs BP (!) 145/93   Pulse (!) 59   Temp 97.8 F (36.6 C)   Resp 17   Ht 5\' 9"  (1.753 m)   Wt 85.3 kg   SpO2 100%   BMI 27.76 kg/m   Physical Exam Vitals signs and nursing note reviewed.  Constitutional:      Appearance: He is well-developed.     Comments: No distress.  HENT:     Head: Normocephalic and atraumatic.  Eyes:     General: No  scleral icterus.    Pupils: Pupils are equal, round, and reactive to light.  Neck:     Musculoskeletal: Normal range of motion.  Cardiovascular:     Heart sounds: Normal heart sounds.     Comments: Bilateral pitting edema. Pulmonary:     Effort: Pulmonary effort is normal.     Breath sounds: Normal breath sounds. No wheezing.     Comments: Lungs are clear to auscultation. Chest:     Chest wall: No tenderness.  Abdominal:     General: Bowel sounds are normal. There is no distension.     Palpations: Abdomen is soft.     Tenderness: There is no abdominal tenderness.  Musculoskeletal:        General: No tenderness or deformity.  Skin:    General: Skin is warm and dry.  Neurological:     Mental Status: He is alert and oriented to person, place, and time.      ED Treatments / Results  Labs (all labs ordered are listed, but only abnormal results are displayed) Labs Reviewed  BASIC METABOLIC PANEL - Abnormal; Notable for the following components:      Result Value   Glucose, Bld 130 (*)    All other components within normal limits  CBC  TROPONIN I (HIGH SENSITIVITY)  TROPONIN I (HIGH SENSITIVITY)    EKG EKG Interpretation  Date/Time:  Monday September 15 2019 15:15:05 EST Ventricular Rate:  74 PR Interval:  164 QRS Duration: 78 QT Interval:  380 QTC Calculation: 421 R Axis:   72 Text Interpretation: Normal sinus rhythm Possible Left atrial enlargement Cannot rule out Anterior infarct , age undetermined Abnormal ECG No old tracing to compare Confirmed by Deno Etienne [phone]) on 09/15/2019 7:06:08 PM   Radiology Dg Chest 2 View  Result Date: 09/15/2019 CLINICAL DATA:  Chest pain. Additional history provided: Near syncopal episode yesterday, mid chest pains with left hand tingling. EXAM: CHEST - 2 VIEW COMPARISON:  No pertinent prior studies available for comparison. FINDINGS: Heart size within normal limits. There is no airspace consolidation within the lungs. No evidence  of pleural effusion or pneumothorax. No acute bony abnormality. Multilevel thoracic spine anterolateral osteophytes. IMPRESSION: No evidence of acute cardiopulmonary abnormality. Electronically Signed   By: Kellie Simmering DO   On: 09/15/2019 15:42    Procedures Procedures (including critical care time)  Medications Ordered in ED Medications  sodium chloride flush (NS) 0.9 % injection 3 mL (has no administration in time range)     Initial Impression / Assessment and Plan / ED Course  I have reviewed the triage vital signs and the nursing notes.  Pertinent labs &  imaging results that were available during my care of the patient were reviewed by me and considered in my medical decision making (see chart for details).      Patient with no pertinent past medical history presents to the ED with 2 episodes of left-sided chest pain with radiation down his left arm, the first episode happened after exertion, had a second episode last night which was at rest.  No prior history of CAD, no prior family history of CAD, no prior MIs.  Does have a prior history of smoking.  CBC is unremarkable without any signs of infection, anemia.  BMP with no electrolyte derangement, creatinine level is within normal limits.  Delta tropes x2 are negative.  Chest x-ray personally reviewed by me, without any consolidation or pneumothorax.  She is overall well-appearing, vitals are within normal limits.  No cough, fevers, infectious sources doubt any pneumonia.  Neurologically intact, low suspicion for dissection.  Heart Score 4  Decision making conversation with patient on needing for admission today, patient has opted to follow-up with his PCP outpatient along with set up a stress test with cardiology.  Suspect patient likely stable angina, will need further cardiology work-up.  He is otherwise more dynamically stable, and pain-free.  I have discussed case with Dr. Tyrone Nine, he is also seen patient and agrees with plan and  management.   8:03 PM patient was ambulated by me, no hypoxia, tachycardia.  Denies any chest pain, shortness of breath.  Patient stable for discharge.  Portions of this note were generated with [employer]. Dictation errors may occur despite best attempts at proofreading.  Final Clinical Impressions(s) / ED Diagnoses   Final diagnoses:  Atypical chest pain    ED Discharge Orders    None       Janeece Fitting, PA-C 09/15/19 2004    Deno Etienne, DO 09/15/19 2013

## 2019-09-15 NOTE — ED Notes (Signed)
Patient verbalizes understanding of discharge instructions. Opportunity for questioning and answers were provided. Armband removed by staff, pt discharged from ED. Pt. ambulatory and discharged home.  

## 2019-09-15 NOTE — Discharge Instructions (Addendum)
Your laboratory results were normal today.   We discussed the risks and benefits of discharge home today.  You will need to follow up with your PCP in order to obtain a cardiology referral for a stress test.  If you experience any worsening symptoms, chest pain, shortness of breath please return to the emergency department.

## 2019-10-02 ENCOUNTER — Encounter: Payer: Self-pay | Admitting: Cardiology

## 2019-10-02 ENCOUNTER — Other Ambulatory Visit: Payer: Self-pay

## 2019-10-02 ENCOUNTER — Ambulatory Visit (INDEPENDENT_AMBULATORY_CARE_PROVIDER_SITE_OTHER): Payer: PPO | Admitting: Cardiology

## 2019-10-02 VITALS — BP 141/90 | HR 60 | Temp 97.8°F | Ht 69.0 in | Wt 191.0 lb

## 2019-10-02 DIAGNOSIS — R0609 Other forms of dyspnea: Secondary | ICD-10-CM | POA: Diagnosis not present

## 2019-10-02 DIAGNOSIS — R739 Hyperglycemia, unspecified: Secondary | ICD-10-CM | POA: Diagnosis not present

## 2019-10-02 DIAGNOSIS — I10 Essential (primary) hypertension: Secondary | ICD-10-CM | POA: Diagnosis not present

## 2019-10-02 DIAGNOSIS — I209 Angina pectoris, unspecified: Secondary | ICD-10-CM

## 2019-10-02 DIAGNOSIS — E785 Hyperlipidemia, unspecified: Secondary | ICD-10-CM | POA: Diagnosis not present

## 2019-10-02 MED ORDER — NITROGLYCERIN 0.4 MG SL SUBL: 0.4000 mg | SUBLINGUAL_TABLET | SUBLINGUAL | 3 refills | Status: DC | PRN

## 2019-10-02 MED ORDER — ATORVASTATIN CALCIUM 10 MG PO TABS: 10.0000 mg | ORAL_TABLET | Freq: Every day | ORAL | 2 refills | Status: DC

## 2019-10-02 MED ORDER — AMLODIPINE BESYLATE 5 MG PO TABS: 5.0000 mg | ORAL_TABLET | Freq: Every day | ORAL | 2 refills | Status: DC

## 2019-10-02 NOTE — Progress Notes (Signed)
Primary Physician/Referring:  Vernie Shanks, MD  Patient ID: Scott Salas, male    DOB: 1951/05/14, 68 y.o.   MRN: [id number]  Chief Complaint  Patient presents with  . New Patient (Initial Visit)  . Chest Pain  . Abnormal ECG   HPI:    Scott Salas  is a 68 y.o. Caucasian male was seen in the emergency room on 09/15/2019 for chest pain and abnormal EKG, now referred to me for further cardiac stratification.  He has history of hyperlipidemia and also hyperglycemia.  Father had CABG at age 64.  Patient states that over the past few months he has noticed every time he has severe exertion, he is chest pain in the left upper part of the chest associated with dyspnea.  He is to rest for a while and the discomfort usually resolves.  2 weeks ago while [place], again had severe chest tightness, but was severe enough that he  presented to the Emergency Room On 09/15/2019.  Routine evaluation was unremarkable and discharged home.  She has not had any recurrence of pain since discharge but states that he has not exerted himself.  Past Medical History:  Diagnosis Date  . Arthritis    Past Surgical History:  Procedure Laterality Date  . acl left knee     left knee  . KNEE ARTHROSCOPY     right  . TONSILLECTOMY     as child  . TOTAL KNEE ARTHROPLASTY Right 07/21/2013   Procedure: RIGHT TOTAL KNEE ARTHROPLASTY;  Surgeon: Gearlean Alf, MD;  Location: [facility];  Service: Orthopedics;  Laterality: Right;   Social History   Socioeconomic History  . Marital status: Married    Spouse name: Not on file  . Number of children: 2  . Years of education: Not on file  . Highest education level: Not on file  Occupational History  . Not on file  Tobacco Use  . Smoking status: Former Smoker    Packs/day: 1.00    Years: 10.00    Pack years: 10.00    Types: Cigarettes    Quit date: 1982    Years since quitting: 38.9  . Smokeless tobacco: Never Used    Substance and Sexual Activity  . Alcohol use: Yes    Comment: occassionally  . Drug use: No  . Sexual activity: Not on file  Other Topics Concern  . Not on file  Social History Narrative   Married.   Lives with spouse.   Living Will, Healthcare POA   Social Determinants of Health   Financial Resource Strain:   . Difficulty of Paying Living Expenses: Not on file  Food Insecurity:   . Worried About Charity fundraiser in the Last Year: Not on file  . Ran Out of Food in the Last Year: Not on file  Transportation Needs:   . Lack of Transportation (Medical): Not on file  . Lack of Transportation (Non-Medical): Not on file  Physical Activity:   . Days of Exercise per Week: Not on file  . Minutes of Exercise per Session: Not on file  Stress:   . Feeling of Stress : Not on file  Social Connections:   . Frequency of Communication with Friends and Family: Not on file  . Frequency of Social Gatherings with Friends and Family: Not on file  . Attends Religious Services: Not on file  . Active Member of Clubs or Organizations: Not on  file  . Attends Archivist Meetings: Not on file  . Marital Status: Not on file  Intimate Partner Violence:   . Fear of Current or Ex-Partner: Not on file  . Emotionally Abused: Not on file  . Physically Abused: Not on file  . Sexually Abused: Not on file   ROS  Review of Systems  Constitution: Negative for chills, decreased appetite, malaise/fatigue and weight gain.  Cardiovascular: Positive for chest pain and dyspnea on exertion. Negative for leg swelling and syncope.  Endocrine: Negative for cold intolerance.  Hematologic/Lymphatic: Does not bruise/bleed easily.  Musculoskeletal: Negative for joint swelling.  Gastrointestinal: Negative for abdominal pain, anorexia, change in bowel habit, hematochezia and melena.  Neurological: Negative for headaches and light-headedness.  Psychiatric/Behavioral: Negative for depression and substance  abuse.  All other systems reviewed and are negative.  Objective  Blood pressure (!) 141/84, pulse 60, temperature 97.8 F (36.6 C), height '5\' 9"'  (1.753 m), weight 191 lb (86.6 kg), SpO2 95 %.  Vitals with BMI 10/02/2019 09/15/2019 09/15/2019  Height '5\' 9"'  - -  Weight 191 lbs - -  BMI 36.14 - -  Systolic 431 - 540  Diastolic 84 - 93  Pulse 60 54 55     Physical Exam  Constitutional: He appears well-developed and well-nourished.  HENT:  Head: Atraumatic.  Eyes: Conjunctivae are normal.  Neck: No JVD present. No thyromegaly present.  Cardiovascular: Normal rate, regular rhythm and intact distal pulses. Exam reveals no gallop.  Murmur heard.  Early systolic murmur is present with a grade of 2/6 at the upper right sternal border. No leg edema, no JVD.  Pulmonary/Chest: Effort normal and breath sounds normal.  Abdominal: Soft. Bowel sounds are normal.  Musculoskeletal:        General: Normal range of motion.     Cervical back: Neck supple.  Neurological: He is alert.  Skin: Skin is warm and dry.  Psychiatric: He has a normal mood and affect.   Laboratory examination:   Recent Labs    09/15/19 1526  NA 138  K 4.4  CL 99  CO2 27  GLUCOSE 130*  BUN 15  CREATININE 0.81  CALCIUM 9.7  GFRNONAA >60  GFRAA >60   estimated creatinine clearance is 97.8 mL/min (by C-G formula based on SCr of 0.81 mg/dL).  CMP Latest Ref Rng & Units 09/15/2019 07/22/2013 07/16/2013  Glucose 70 - 99 mg/dL 130(H) 164(H) 94  BUN 8 - 23 mg/dL '15 12 17  ' Creatinine 0.61 - 1.24 mg/dL 0.81 0.61 0.98  Sodium 135 - 145 mmol/L 138 133(L) 139  Potassium 3.5 - 5.1 mmol/L 4.4 4.4 4.9  Chloride 98 - 111 mmol/L 99 99 101  CO2 22 - 32 mmol/L '27 23 29  ' Calcium 8.9 - 10.3 mg/dL 9.7 9.3 9.8  Total Protein 6.0 - 8.3 g/dL - - 7.4  Total Bilirubin 0.3 - 1.2 mg/dL - - 0.4  Alkaline Phos 39 - 117 U/L - - 67  AST 0 - 37 U/L - - 23  ALT 0 - 53 U/L - - 23   CBC Latest Ref Rng & Units 09/15/2019 07/22/2013 07/16/2013   WBC 4.0 - 10.5 K/uL 7.2 17.3(H) 6.8  Hemoglobin 13.0 - 17.0 g/dL 15.4 12.6(L) 14.2  Hematocrit 39.0 - 52.0 % 45.7 35.4(L) 41.8  Platelets 150 - 400 K/uL 252 301 286   Lipid Panel  No results found for: CHOL, TRIG, HDL, CHOLHDL, VLDL, LDLCALC, LDLDIRECT HEMOGLOBIN A1C No results found for: HGBA1C, MPG  TSH No results for input(s): TSH in the last 8760 hours.  Labs 07/16/2019: Serum glucose 100 mg, BUN 16, creatinine 0.89, EGFR >60 mL.  Potassium 4.8.  CMP normal. Total cholesterol 218, triglycerides 120, HDL 57, LDL 136, non-HDL cholesterol 160.  Labs 05/08/2018: A1c 6.1%.  Serum glucose 128 mg. BUN 14, creatinine 0.86, EGFR >60 mL.  Potassium 5.6.  CMP otherwise normal.  Total cholesterol scratch that Total cholesterol 208, triglycerides 103, HDL 59, LDL 129, non-HDL cholesterol 149.  Medications and allergies  No Known Allergies   Current Outpatient Medications  Medication Instructions  . amLODipine (NORVASC) 5 mg, Oral, Daily  . Ascorbic Acid (VITAMIN C) 500 MG CAPS Oral, 2 times daily  . aspirin EC 81 mg, Oral, Daily  . atorvastatin (LIPITOR) 10 mg, Oral, Daily  . cholecalciferol (VITAMIN D3) 1,000 Units, Oral, 2 times daily  . fluticasone (FLONASE) 50 MCG/ACT nasal spray 1 spray, Daily PRN  . nitroGLYCERIN (NITROSTAT) 0.4 mg, Sublingual, Every 5 min PRN    Radiology:  Chest x-ray 09/15/2019: Heart size within normal limits. There is no airspace consolidation within the lungs. No evidence of pleural effusion or pneumothorax. No acute bony abnormality. Multilevel thoracic spine anterolateral osteophytes  Cardiac Studies:   None  Assessment     ICD-10-CM   1. Angina pectoris (HCC)  I20.9 EKG 12-Lead    amLODipine (NORVASC) 5 MG tablet    nitroGLYCERIN (NITROSTAT) 0.4 MG SL tablet    PCV ECHOCARDIOGRAM COMPLETE    PCV MYOCARDIAL PERFUSION WITH LEXISCAN  2. Dyspnea on exertion  R06.00   3. Essential hypertension  I10 amLODipine (NORVASC) 5 MG tablet  4. Mild  hyperlipidemia  E78.5 atorvastatin (LIPITOR) 10 MG tablet  5. Hyperglycemia  R73.9     EKG 10/02/2019: Sinus bradycardia at rate of 59 bpm, left atrial enlargement, normal axis.  Poor R wave progression, probably normal variant.  Nonspecific inferior T abnormalities.  No significant change from 09/15/2019.  T wave inversion slightly less prominent compared to previous.    Recommendations:   Meds ordered this encounter  Medications  . amLODipine (NORVASC) 5 MG tablet    Sig: Take 1 tablet (5 mg total) by mouth daily.    Dispense:  30 tablet    Refill:  2  . atorvastatin (LIPITOR) 10 MG tablet    Sig: Take 1 tablet (10 mg total) by mouth daily.    Dispense:  30 tablet    Refill:  2  . nitroGLYCERIN (NITROSTAT) 0.4 MG SL tablet    Sig: Place 1 tablet (0.4 mg total) under the tongue every 5 (five) minutes as needed for up to 25 days for chest pain.    Dispense:  25 tablet    Refill:  3    Scott Salas  is a 68 y.o. Caucasian male was seen in the emergency room on 09/15/2019 for chest pain and abnormal EKG, now referred to me for further cardiac stratification.  He has history of hyperlipidemia and also hyperglycemia.  Father had CABG at age 43.  There pain is classic for angina pectoris with heavy exertional activity bringing on Chest pain and relieved with rest.  He is also noticed exertional dyspnea with the last few months. Also the EKG abnormality he presented with chest pain revealed much more significant inferior and possibly lateral T wave inversion suggestive of ischemia although troponins were negative.  S/L NTG was prescribed and explained how to and when to use it and to notify us if  there is change in frequency of use. Interaction with cialis-like agents (if applicable was discussed). I will also start him on amlodipine 5 mg daily both for angina pectoris, he also has mild hypertension.  He has noticed his blood pressure recordings at home to be 90 mmHg diastolic.  Today it is  also elevated.  With regard to hyperlipidemia, will start him on Lipitor 10 mg daily to of angina pectoris as well.  I discussed regarding hyperglycemia and effect of statins on glucose.  Schedule for a Lexiscan Sestamibi stress test to evaluate for myocardial ischemia. Patient unable to do treadmill stress testing due to Covid-19 to reduce aerosol spread.  Will schedule for an echocardiogram. Office visit following the work-up/investigations.   Adrian Prows, MD, [facility] 10/02/2019, 3:08 PM [facility] Pager: [phone] Office: [phone]

## 2019-10-13 ENCOUNTER — Other Ambulatory Visit: Payer: Self-pay

## 2019-10-13 ENCOUNTER — Ambulatory Visit (INDEPENDENT_AMBULATORY_CARE_PROVIDER_SITE_OTHER): Payer: PPO

## 2019-10-13 DIAGNOSIS — I209 Angina pectoris, unspecified: Secondary | ICD-10-CM | POA: Diagnosis not present

## 2019-10-20 ENCOUNTER — Other Ambulatory Visit: Payer: Self-pay

## 2019-10-20 ENCOUNTER — Ambulatory Visit (INDEPENDENT_AMBULATORY_CARE_PROVIDER_SITE_OTHER): Payer: PPO

## 2019-10-20 DIAGNOSIS — I209 Angina pectoris, unspecified: Secondary | ICD-10-CM

## 2019-11-13 ENCOUNTER — Encounter: Payer: Self-pay | Admitting: Cardiology

## 2019-11-13 ENCOUNTER — Ambulatory Visit (INDEPENDENT_AMBULATORY_CARE_PROVIDER_SITE_OTHER): Payer: PPO | Admitting: Cardiology

## 2019-11-13 ENCOUNTER — Other Ambulatory Visit: Payer: Self-pay

## 2019-11-13 VITALS — BP 117/75 | HR 60 | Temp 96.6°F | Ht 70.0 in | Wt 191.5 lb

## 2019-11-13 DIAGNOSIS — I10 Essential (primary) hypertension: Secondary | ICD-10-CM | POA: Diagnosis not present

## 2019-11-13 DIAGNOSIS — I209 Angina pectoris, unspecified: Secondary | ICD-10-CM

## 2019-11-13 DIAGNOSIS — E785 Hyperlipidemia, unspecified: Secondary | ICD-10-CM

## 2019-11-13 DIAGNOSIS — I351 Nonrheumatic aortic (valve) insufficiency: Secondary | ICD-10-CM | POA: Diagnosis not present

## 2019-11-13 MED ORDER — AMLODIPINE BESYLATE 5 MG PO TABS: 5.0000 mg | ORAL_TABLET | Freq: Every day | ORAL | 3 refills | Status: DC

## 2019-11-13 NOTE — Progress Notes (Signed)
Primary Physician/Referring:  Vernie Shanks, MD  Patient ID: Scott Salas, male    DOB: 12-22-1950, 69 y.o.   MRN: [id number]  No chief complaint on file.  HPI:    Scott Salas  is a 69 y.o. Caucasian male was seen in the emergency room on 09/15/2019 for chest pain and abnormal EKG, hyperlipidemia and also hyperglycemia.  Father had CABG at age 78.    Chest pain is classic for angina pectoris with heavy exertional activity bringing on Chest pain and relieved with rest.  He is also noticed exertional dyspnea with the last few months and EKG revealed much more significant inferior and possibly lateral T wave inversion suggestive of ischemia although troponins were negative on 09/15/2019 with ED presentation. I had started amlodipine and also atorvastatin 10 mg.  He is tolerating this without any complications.. He has not had recurrence of chest pain and has returned to full activity.  Past Medical History:  Diagnosis Date  . Arthritis    Past Surgical History:  Procedure Laterality Date  . acl left knee     left knee  . KNEE ARTHROSCOPY     right  . TONSILLECTOMY     as child  . TOTAL KNEE ARTHROPLASTY Right 07/21/2013   Procedure: RIGHT TOTAL KNEE ARTHROPLASTY;  Surgeon: Gearlean Alf, MD;  Location: [facility];  Service: Orthopedics;  Laterality: Right;   Social History   Socioeconomic History  . Marital status: Married    Spouse name: Not on file  . Number of children: 2  . Years of education: Not on file  . Highest education level: Not on file  Occupational History  . Not on file  Tobacco Use  . Smoking status: Former Smoker    Packs/day: 1.00    Years: 10.00    Pack years: 10.00    Types: Cigarettes    Quit date: 1982    Years since quitting: 39.1  . Smokeless tobacco: Never Used  Substance and Sexual Activity  . Alcohol use: Yes    Comment: occassionally  . Drug use: No  . Sexual activity: Not on file  Other Topics Concern  . Not on file  Social History  Narrative   Married.   Lives with spouse.   Living Will, Healthcare POA   Social Determinants of Health   Financial Resource Strain:   . Difficulty of Paying Living Expenses: Not on file  Food Insecurity:   . Worried About Charity fundraiser in the Last Year: Not on file  . Ran Out of Food in the Last Year: Not on file  Transportation Needs:   . Lack of Transportation (Medical): Not on file  . Lack of Transportation (Non-Medical): Not on file  Physical Activity:   . Days of Exercise per Week: Not on file  . Minutes of Exercise per Session: Not on file  Stress:   . Feeling of Stress : Not on file  Social Connections:   . Frequency of Communication with Friends and Family: Not on file  . Frequency of Social Gatherings with Friends and Family: Not on file  . Attends Religious Services: Not on file  . Active Member of Clubs or Organizations: Not on file  . Attends Archivist Meetings: Not on file  . Marital Status: Not on file  Intimate Partner Violence:   . Fear of Current or Ex-Partner: Not on file  . Emotionally Abused: Not on file  . Physically Abused: Not  on file  . Sexually Abused: Not on file   ROS  Review of Systems  Constitution: Negative for weight gain.  Cardiovascular: Negative for chest pain, dyspnea on exertion, leg swelling and syncope.  Respiratory: Negative for hemoptysis.   Endocrine: Negative for cold intolerance.  Hematologic/Lymphatic: Does not bruise/bleed easily.  Gastrointestinal: Negative for hematochezia and melena.  Neurological: Negative for headaches and light-headedness.   Objective  There were no vitals taken for this visit.  Vitals with BMI 10/02/2019 09/15/2019 09/15/2019  Height '5\' 9"'  - -  Weight 191 lbs - -  BMI 50.53 - -  Systolic 976 - 734  Diastolic 90 - 93  Pulse 60 54 55     Physical Exam  Constitutional: He appears well-developed and well-nourished.  HENT:  Head: Atraumatic.  Eyes: Conjunctivae are normal.   Neck: No JVD present. No thyromegaly present.  Cardiovascular: Normal rate, regular rhythm and intact distal pulses. Exam reveals no gallop.  Murmur heard.  Early systolic murmur is present with a grade of 2/6 at the upper right sternal border. No leg edema, no JVD.  Pulmonary/Chest: Effort normal and breath sounds normal.  Abdominal: Soft. Bowel sounds are normal.  Musculoskeletal:        General: Normal range of motion.     Cervical back: Neck supple.  Neurological: He is alert.  Skin: Skin is warm and dry.  Psychiatric: He has a normal mood and affect.   Laboratory examination:   Recent Labs    09/15/19 1526  NA 138  K 4.4  CL 99  CO2 27  GLUCOSE 130*  BUN 15  CREATININE 0.81  CALCIUM 9.7  GFRNONAA >60  GFRAA >60   CrCl cannot be calculated (Patient's most recent lab result is older than the maximum 21 days allowed.).  CMP Latest Ref Rng & Units 09/15/2019 07/22/2013 07/16/2013  Glucose 70 - 99 mg/dL 130(H) 164(H) 94  BUN 8 - 23 mg/dL '15 12 17  ' Creatinine 0.61 - 1.24 mg/dL 0.81 0.61 0.98  Sodium 135 - 145 mmol/L 138 133(L) 139  Potassium 3.5 - 5.1 mmol/L 4.4 4.4 4.9  Chloride 98 - 111 mmol/L 99 99 101  CO2 22 - 32 mmol/L '27 23 29  ' Calcium 8.9 - 10.3 mg/dL 9.7 9.3 9.8  Total Protein 6.0 - 8.3 g/dL - - 7.4  Total Bilirubin 0.3 - 1.2 mg/dL - - 0.4  Alkaline Phos 39 - 117 U/L - - 67  AST 0 - 37 U/L - - 23  ALT 0 - 53 U/L - - 23   CBC Latest Ref Rng & Units 09/15/2019 07/22/2013 07/16/2013  WBC 4.0 - 10.5 K/uL 7.2 17.3(H) 6.8  Hemoglobin 13.0 - 17.0 g/dL 15.4 12.6(L) 14.2  Hematocrit 39.0 - 52.0 % 45.7 35.4(L) 41.8  Platelets 150 - 400 K/uL 252 301 286   Lipid Panel  No results found for: CHOL, TRIG, HDL, CHOLHDL, VLDL, LDLCALC, LDLDIRECT HEMOGLOBIN A1C No results found for: HGBA1C, MPG TSH No results for input(s): TSH in the last 8760 hours.  Labs 07/16/2019: Serum glucose 100 mg, BUN 16, creatinine 0.89, EGFR >60 mL.  Potassium 4.8.  CMP normal. Total  cholesterol 218, triglycerides 120, HDL 57, LDL 136, non-HDL cholesterol 160.  Labs 05/08/2018: A1c 6.1%.  Serum glucose 128 mg. BUN 14, creatinine 0.86, EGFR >60 mL.  Potassium 5.6.  CMP otherwise normal.  Total cholesterol scratch that Total cholesterol 208, triglycerides 103, HDL 59, LDL 129, non-HDL cholesterol 149.  Medications and allergies  No Known Allergies   Current Outpatient Medications  Medication Instructions  . amLODipine (NORVASC) 5 mg, Oral, Daily  . Ascorbic Acid (VITAMIN C) 500 MG CAPS Oral, 2 times daily  . aspirin EC 81 mg, Oral, Daily  . atorvastatin (LIPITOR) 10 mg, Oral, Daily  . cholecalciferol (VITAMIN D3) 1,000 Units, Oral, 2 times daily  . fluticasone (FLONASE) 50 MCG/ACT nasal spray 1 spray, Daily PRN  . nitroGLYCERIN (NITROSTAT) 0.4 mg, Sublingual, Every 5 min PRN   Radiology:  Chest x-ray 09/15/2019: Heart size within normal limits. There is no airspace consolidation within the lungs. No evidence of pleural effusion or pneumothorax. No acute bony abnormality. Multilevel thoracic spine anterolateral osteophytes  Cardiac Studies:   Echocardiogram 10/20/2019: Left ventricle cavity is normal in size. Moderate concentric hypertrophy of the left ventricle. Normal LV systolic function with EF 68%. Normal global wall motion. Doppler evidence of grade I (impaired) diastolic dysfunction, normal LAP.  Left atrial cavity is mildly dilated. Trileaflet aortic valve.  Mild (Grade I) aortic regurgitation. Mild (Grade I) mitral regurgitation. Inadequate TR jet to estimate pulmonary artery systolic pressure. Estimated RA pressure 8 mmHg.  Lexiscan Tetrofosmin Stress Test  10/13/2019: Nondiagnostic ECG stress. Normal myocardial perfusion. All segments of left ventricle demonstrated normal wall motion and thickening. Stress LV EF is hyperdynamic 80%.  Stress LV EF: 80%.  Low risk study. No previous exam available for comparison.  Assessment     ICD-10-CM   1.  Angina pectoris (HCC)  I20.9   2. Dyspnea on exertion  R06.00   3. Essential hypertension  I10   4. Mild hyperlipidemia  E78.5     EKG 10/02/2019: Sinus bradycardia at rate of 59 bpm, left atrial enlargement, normal axis.  Poor R wave progression, probably normal variant.  Nonspecific inferior T abnormalities.  No significant change from 09/15/2019.  T wave inversion slightly less prominent compared to previous.    Recommendations:   No orders of the defined types were placed in this encounter.   Scott Salas  is a 69 y.o. Caucasian male was seen in the emergency room on 09/15/2019 for chest pain and abnormal EKG, hyperlipidemia and also hyperglycemia.  Father had CABG at age 75.    Chest pain is classic for angina pectoris with heavy exertional activity bringing on Chest pain and relieved with rest.  He is also noticed exertional dyspnea with the last few months and EKG revealed much more significant inferior and possibly lateral T wave inversion suggestive of ischemia although troponins were negative on 09/15/2019 with ED presentation.   Stress test  reviewed with him along with echocardiogram, I had started amlodipine and also atorvastatin 10 mg.  He is tolerating this without any complications.  Blood pressure is also well controlled with resolution of symptoms. Dyspnea has also essentially resolved. He had noticed elevated BP prior to starting amlodipine. Although negative nuclear stress test, he probably has underlying coronary atherosclerosis, advised him to continue present medications and will obtain labs.  I will see him back in 1 year or sooner if problems.   Adrian Prows, MD, [facility] 11/13/2019, 6:24 AM [facility]

## 2019-12-02 ENCOUNTER — Other Ambulatory Visit: Payer: Self-pay | Admitting: Cardiology

## 2019-12-02 DIAGNOSIS — E785 Hyperlipidemia, unspecified: Secondary | ICD-10-CM | POA: Diagnosis not present

## 2019-12-03 LAB — LIPID PANEL WITH LDL/HDL RATIO
Cholesterol, Total: 163 mg/dL (ref 100–199)
HDL: 60 mg/dL (ref >39)
LDL Chol Calc (NIH): 85 mg/dL (ref 0–99)
LDL/HDL Ratio: 1.4 ratio (ref 0.0–3.6)
Triglycerides: 102 mg/dL (ref 0–149)
VLDL Cholesterol Cal: 18 mg/dL (ref 5–40)

## 2019-12-03 LAB — LIPOPROTEIN A (LPA): Lipoprotein (a): 28.3 nmol/L (ref <75.0)

## 2019-12-03 NOTE — Progress Notes (Signed)
Normal lipids and LPA.

## 2019-12-10 ENCOUNTER — Telehealth: Payer: Self-pay

## 2019-12-10 NOTE — Telephone Encounter (Signed)
Pt called stating that since starting the Lipitor and Amlodipine at last ov, he has been having vivid dreams that he wasn't having before. He's sleeping well, just the dreams. Wants to know if this could be a side effect from one of the medications. Please advise.//ah

## 2019-12-10 NOTE — Telephone Encounter (Signed)
Stop lipitor for a week and see what happens, continue amlodipine and then introduce Lipitor

## 2019-12-11 ENCOUNTER — Telehealth: Payer: Self-pay

## 2019-12-11 NOTE — Telephone Encounter (Signed)
spoke to pt he will stop lipitor for 1 week and let us know what happens.

## 2019-12-15 NOTE — Telephone Encounter (Signed)
Pt made aware by TO. See other message

## 2019-12-22 ENCOUNTER — Telehealth: Payer: Self-pay

## 2019-12-22 NOTE — Telephone Encounter (Signed)
Pt called and said that he thinks the stain in giving him aches. Would it be ok for him to take this every other day to see if that helps or would you recommend trying another stain?

## 2019-12-22 NOTE — Telephone Encounter (Signed)
Crestor 10 mg daily and mark Lipitor as intolerance in allergy section

## 2019-12-23 ENCOUNTER — Other Ambulatory Visit: Payer: Self-pay

## 2019-12-23 MED ORDER — ROSUVASTATIN CALCIUM 10 MG PO TABS: 10.0000 mg | ORAL_TABLET | Freq: Every day | ORAL | 0 refills | Status: DC

## 2020-01-19 ENCOUNTER — Other Ambulatory Visit: Payer: Self-pay

## 2020-01-19 MED ORDER — ROSUVASTATIN CALCIUM 10 MG PO TABS: 10.0000 mg | ORAL_TABLET | Freq: Every day | ORAL | 3 refills | Status: DC

## 2020-01-30 DIAGNOSIS — R2689 Other abnormalities of gait and mobility: Secondary | ICD-10-CM | POA: Diagnosis not present

## 2020-01-30 DIAGNOSIS — R531 Weakness: Secondary | ICD-10-CM | POA: Diagnosis not present

## 2020-02-04 DIAGNOSIS — R2689 Other abnormalities of gait and mobility: Secondary | ICD-10-CM | POA: Diagnosis not present

## 2020-02-04 DIAGNOSIS — R531 Weakness: Secondary | ICD-10-CM | POA: Diagnosis not present

## 2020-02-09 DIAGNOSIS — R531 Weakness: Secondary | ICD-10-CM | POA: Diagnosis not present

## 2020-02-09 DIAGNOSIS — R2689 Other abnormalities of gait and mobility: Secondary | ICD-10-CM | POA: Diagnosis not present

## 2020-02-23 DIAGNOSIS — R2689 Other abnormalities of gait and mobility: Secondary | ICD-10-CM | POA: Diagnosis not present

## 2020-02-23 DIAGNOSIS — R531 Weakness: Secondary | ICD-10-CM | POA: Diagnosis not present

## 2020-03-08 DIAGNOSIS — R531 Weakness: Secondary | ICD-10-CM | POA: Diagnosis not present

## 2020-03-08 DIAGNOSIS — R2689 Other abnormalities of gait and mobility: Secondary | ICD-10-CM | POA: Diagnosis not present

## 2020-04-20 ENCOUNTER — Telehealth: Payer: Self-pay

## 2020-04-20 DIAGNOSIS — E785 Hyperlipidemia, unspecified: Secondary | ICD-10-CM

## 2020-04-20 DIAGNOSIS — I209 Angina pectoris, unspecified: Secondary | ICD-10-CM

## 2020-04-20 NOTE — Telephone Encounter (Signed)
I sent mychart message

## 2020-04-20 NOTE — Telephone Encounter (Signed)
Pt called to inform us that crestor 10mg  is not working. Pt mention that he wakes up tired and with muscle aches. Please advise

## 2020-04-21 NOTE — Telephone Encounter (Signed)
From patient.

## 2020-04-23 DIAGNOSIS — E7800 Pure hypercholesterolemia, unspecified: Secondary | ICD-10-CM | POA: Diagnosis not present

## 2020-04-23 DIAGNOSIS — G479 Sleep disorder, unspecified: Secondary | ICD-10-CM | POA: Diagnosis not present

## 2020-04-23 DIAGNOSIS — R7303 Prediabetes: Secondary | ICD-10-CM | POA: Diagnosis not present

## 2020-04-23 DIAGNOSIS — R5383 Other fatigue: Secondary | ICD-10-CM | POA: Diagnosis not present

## 2020-04-23 DIAGNOSIS — N529 Male erectile dysfunction, unspecified: Secondary | ICD-10-CM | POA: Diagnosis not present

## 2020-04-23 DIAGNOSIS — Z96651 Presence of right artificial knee joint: Secondary | ICD-10-CM | POA: Diagnosis not present

## 2020-05-12 NOTE — Telephone Encounter (Signed)
From pt

## 2020-05-13 DIAGNOSIS — S39012A Strain of muscle, fascia and tendon of lower back, initial encounter: Secondary | ICD-10-CM | POA: Diagnosis not present

## 2020-06-16 ENCOUNTER — Other Ambulatory Visit: Payer: Self-pay | Admitting: Cardiology

## 2020-06-16 DIAGNOSIS — G4719 Other hypersomnia: Secondary | ICD-10-CM | POA: Diagnosis not present

## 2020-06-16 DIAGNOSIS — I209 Angina pectoris, unspecified: Secondary | ICD-10-CM

## 2020-06-16 DIAGNOSIS — G720 Drug-induced myopathy: Secondary | ICD-10-CM | POA: Diagnosis not present

## 2020-06-16 DIAGNOSIS — E785 Hyperlipidemia, unspecified: Secondary | ICD-10-CM

## 2020-06-16 DIAGNOSIS — R0683 Snoring: Secondary | ICD-10-CM | POA: Diagnosis not present

## 2020-06-16 DIAGNOSIS — T466X5A Adverse effect of antihyperlipidemic and antiarteriosclerotic drugs, initial encounter: Secondary | ICD-10-CM | POA: Diagnosis not present

## 2020-06-16 MED ORDER — LIVALO 2 MG PO TABS: 1.0000 | ORAL_TABLET | Freq: Every day | ORAL | 2 refills | Status: DC

## 2020-06-16 NOTE — Telephone Encounter (Signed)
Patient to send me a MyChart message stating that he is having severe myalgias and also generalized weakness with Crestor.  He has been intolerant to atorvastatin.  I will try Livalo 2 mg daily, will recheck lipids in 3 months and see him back at that time.    ICD-10-CM   1. Mild hyperlipidemia  E78.5 Pitavastatin Calcium (LIVALO) 2 MG TABS    Lipid Panel With LDL/HDL Ratio    Lipid Panel With LDL/HDL Ratio  2. Angina pectoris (HCC)  I20.9 Pitavastatin Calcium (LIVALO) 2 MG TABS    Lipid Panel With LDL/HDL Ratio    Lipid Panel With LDL/HDL Ratio

## 2020-06-16 NOTE — Telephone Encounter (Signed)
From pt

## 2020-07-09 NOTE — Telephone Encounter (Signed)
From pt

## 2020-07-12 MED ORDER — NEXLIZET 180-10 MG PO TABS: 1.0000 | ORAL_TABLET | Freq: Every day | ORAL | 2 refills | Status: DC

## 2020-07-12 NOTE — Telephone Encounter (Signed)
ICD-10-CM   1. Mild hyperlipidemia  E78.5 Pitavastatin Calcium (LIVALO) 2 MG TABS    Lipid Panel With LDL/HDL Ratio    CANCELED: Lipid Panel With LDL/HDL Ratio    CANCELED: Lipid Panel With LDL/HDL Ratio  2. Angina pectoris (HCC)  I20.9 Pitavastatin Calcium (LIVALO) 2 MG TABS    Lipid Panel With LDL/HDL Ratio    CANCELED: Lipid Panel With LDL/HDL Ratio    CANCELED: Lipid Panel With LDL/HDL Ratio

## 2020-07-12 NOTE — Telephone Encounter (Signed)
From patient.

## 2020-07-12 NOTE — Addendum Note (Signed)
Addended by: Kela Millin on: 07/12/2020 12:59 PM   Modules accepted: Orders

## 2020-07-12 NOTE — Telephone Encounter (Signed)
ICD-10-CM   1. Mild hyperlipidemia  E78.5 Lipid Panel With LDL/HDL Ratio    Bempedoic Acid-Ezetimibe (NEXLIZET) 180-10 MG TABS    DISCONTINUED: Pitavastatin Calcium (LIVALO) 2 MG TABS    CANCELED: Lipid Panel With LDL/HDL Ratio    CANCELED: Lipid Panel With LDL/HDL Ratio  2. Angina pectoris (Reserve)  I20.9 Lipid Panel With LDL/HDL Ratio    Bempedoic Acid-Ezetimibe (NEXLIZET) 180-10 MG TABS    DISCONTINUED: Pitavastatin Calcium (LIVALO) 2 MG TABS    CANCELED: Lipid Panel With LDL/HDL Ratio    CANCELED: Lipid Panel With LDL/HDL Ratio    Patient wants to have repeat lipids prior to starting Nexlizet   Adrian Prows, MD, [facility] 07/12/2020, 12:58 PM Office: [phone]

## 2020-07-13 DIAGNOSIS — E785 Hyperlipidemia, unspecified: Secondary | ICD-10-CM | POA: Diagnosis not present

## 2020-07-13 DIAGNOSIS — I209 Angina pectoris, unspecified: Secondary | ICD-10-CM | POA: Diagnosis not present

## 2020-07-14 DIAGNOSIS — G4733 Obstructive sleep apnea (adult) (pediatric): Secondary | ICD-10-CM | POA: Diagnosis not present

## 2020-07-14 LAB — LIPID PANEL WITH LDL/HDL RATIO
Cholesterol, Total: 214 mg/dL — ABNORMAL HIGH (ref 100–199)
HDL: 65 mg/dL (ref >39)
LDL Chol Calc (NIH): 135 mg/dL — ABNORMAL HIGH (ref 0–99)
LDL/HDL Ratio: 2.1 ratio (ref 0.0–3.6)
Triglycerides: 80 mg/dL (ref 0–149)
VLDL Cholesterol Cal: 14 mg/dL (ref 5–40)

## 2020-07-15 DIAGNOSIS — E7800 Pure hypercholesterolemia, unspecified: Secondary | ICD-10-CM | POA: Diagnosis not present

## 2020-07-15 DIAGNOSIS — G4733 Obstructive sleep apnea (adult) (pediatric): Secondary | ICD-10-CM | POA: Diagnosis not present

## 2020-07-15 DIAGNOSIS — R7303 Prediabetes: Secondary | ICD-10-CM | POA: Diagnosis not present

## 2020-07-15 DIAGNOSIS — G4719 Other hypersomnia: Secondary | ICD-10-CM | POA: Diagnosis not present

## 2020-08-17 DIAGNOSIS — H25013 Cortical age-related cataract, bilateral: Secondary | ICD-10-CM | POA: Diagnosis not present

## 2020-08-17 DIAGNOSIS — H524 Presbyopia: Secondary | ICD-10-CM | POA: Diagnosis not present

## 2020-08-17 DIAGNOSIS — H1789 Other corneal scars and opacities: Secondary | ICD-10-CM | POA: Diagnosis not present

## 2020-08-17 DIAGNOSIS — H2513 Age-related nuclear cataract, bilateral: Secondary | ICD-10-CM | POA: Diagnosis not present

## 2020-08-20 DIAGNOSIS — Z1389 Encounter for screening for other disorder: Secondary | ICD-10-CM | POA: Diagnosis not present

## 2020-08-20 DIAGNOSIS — Z0000 Encounter for general adult medical examination without abnormal findings: Secondary | ICD-10-CM | POA: Diagnosis not present

## 2020-08-20 DIAGNOSIS — Z23 Encounter for immunization: Secondary | ICD-10-CM | POA: Diagnosis not present

## 2020-10-05 DIAGNOSIS — M1712 Unilateral primary osteoarthritis, left knee: Secondary | ICD-10-CM | POA: Diagnosis not present

## 2020-10-05 DIAGNOSIS — Z96651 Presence of right artificial knee joint: Secondary | ICD-10-CM | POA: Diagnosis not present

## 2020-11-12 ENCOUNTER — Ambulatory Visit: Payer: PPO | Admitting: Cardiology

## 2020-11-17 ENCOUNTER — Encounter: Payer: Self-pay | Admitting: Cardiology

## 2020-11-17 ENCOUNTER — Ambulatory Visit: Payer: PPO | Admitting: Cardiology

## 2020-11-17 ENCOUNTER — Other Ambulatory Visit: Payer: Self-pay

## 2020-11-17 VITALS — BP 133/85 | HR 72 | Temp 97.9°F | Resp 16 | Ht 70.0 in | Wt 198.0 lb

## 2020-11-17 DIAGNOSIS — G4733 Obstructive sleep apnea (adult) (pediatric): Secondary | ICD-10-CM | POA: Diagnosis not present

## 2020-11-17 DIAGNOSIS — I209 Angina pectoris, unspecified: Secondary | ICD-10-CM

## 2020-11-17 DIAGNOSIS — I10 Essential (primary) hypertension: Secondary | ICD-10-CM

## 2020-11-17 DIAGNOSIS — E785 Hyperlipidemia, unspecified: Secondary | ICD-10-CM

## 2020-11-17 MED ORDER — ROSUVASTATIN CALCIUM 10 MG PO TABS: 10.0000 mg | ORAL_TABLET | ORAL | 3 refills | Status: DC

## 2020-11-17 NOTE — Progress Notes (Signed)
Primary Physician/Referring:  Vernie Shanks, MD  Patient ID: Scott Salas, male    DOB: 1951/02/20, 70 y.o.   MRN: [id number]  Chief Complaint  Patient presents with  . Chest Pain  . Hyperlipidemia  . Follow-up    1 YEAR   HPI:    Scott Salas  is a 70 y.o. Caucasian male with chronic stable angina pectoris, chronic dyspnea on exertion, on aggressive medical therapy symptoms have completely resolved.  His cardiovascular risks include family history of premature coronary disease with father had CABG at age 107, hypertension, hyperlipidemia and hyperglycemia.    Patient has discontinued all his medications and states that with antihypertensive medications he was having significant dizzy spells and brings home recordings.  Blood pressure is very well controlled.  He also discontinued statins and also bempedoic acid due to myalgias and arthralgias.  He has not had any recurrence of chest pain and continues to play tennis almost daily without any restrictions.  Past Medical History:  Diagnosis Date  . Arthritis   . Chest pain   . Hyperlipidemia   . Hypertension    Past Surgical History:  Procedure Laterality Date  . acl left knee     left knee  . KNEE ARTHROSCOPY     right  . TONSILLECTOMY     as child  . TOTAL KNEE ARTHROPLASTY Right 07/21/2013   Procedure: RIGHT TOTAL KNEE ARTHROPLASTY;  Surgeon: Gearlean Alf, MD;  Location: [facility];  Service: Orthopedics;  Laterality: Right;    Social History   Tobacco Use  . Smoking status: Former Smoker    Packs/day: 1.00    Years: 10.00    Pack years: 10.00    Types: Cigarettes    Quit date: 1982    Years since quitting: 40.1  . Smokeless tobacco: Never Used  Substance Use Topics  . Alcohol use: Yes    Comment: occassionally  Marital Status: Married    ROS  Review of Systems  Cardiovascular: Negative for chest pain, dyspnea on exertion and leg swelling.  Respiratory: Positive for snoring.   Gastrointestinal: Negative  for melena.   Objective  Blood pressure 133/85, pulse 72, temperature 97.9 F (36.6 C), temperature source Temporal, resp. rate 16, height 5' 10" (1.778 m), weight 198 lb (89.8 kg), SpO2 94 %.  Vitals with BMI 11/17/2020 11/13/2019 10/02/2019  Height 5' 10" 5' 10" 5' 9"  Weight 198 lbs 191 lbs 8 oz 191 lbs  BMI 28.41 85.02 77.41  Systolic 287 867 672  Diastolic 85 75 90  Pulse 72 60 60     Physical Exam Constitutional:      Appearance: He is well-developed.  Neck:     Thyroid: No thyromegaly.     Vascular: No JVD.  Cardiovascular:     Rate and Rhythm: Normal rate and regular rhythm.     Pulses: Normal pulses and intact distal pulses.     Heart sounds: No murmur heard.  Early systolic murmur is present at the upper right sternal border. No gallop.      Comments: No leg edema, no JVD. Pulmonary:     Effort: Pulmonary effort is normal.     Breath sounds: Normal breath sounds.  Abdominal:     General: Bowel sounds are normal.     Palpations: Abdomen is soft.  Musculoskeletal:     Cervical back: Neck supple.  Skin:    General: Skin is warm and dry.  Neurological:  Mental Status: He is alert.    Laboratory examination:   No results for input(s): NA, K, CL, CO2, GLUCOSE, BUN, CREATININE, CALCIUM, GFRNONAA, GFRAA in the last 8760 hours. CrCl cannot be calculated (Patient's most recent lab result is older than the maximum 21 days allowed.).  CMP Latest Ref Rng & Units 09/15/2019 07/22/2013 07/16/2013  Glucose 70 - 99 mg/dL 130(H) 164(H) 94  BUN 8 - 23 mg/dL _0 Creatinine 0.61 - 1.24 mg/dL 0.81 0.61 0.98  Sodium 135 - 145 mmol/L 138 133(L) 139  Potassium 3.5 - 5.1 mmol/L 4.4 4.4 4.9  Chloride 98 - 111 mmol/L 99 99 101  CO2 22 - 32 mmol/L _1 Calcium 8.9 - 10.3 mg/dL 9.7 9.3 9.8  Total Protein 6.0 - 8.3 g/dL - - 7.4  Total Bilirubin 0.3 - 1.2 mg/dL - - 0.4  Alkaline Phos 39 - 117 U/L - - 67  AST 0 - 37 U/L - - 23  ALT 0 - 53 U/L - - 23   CBC Latest Ref Rng &  Units 09/15/2019 07/22/2013 07/16/2013  WBC 4.0 - 10.5 K/uL 7.2 17.3(H) 6.8  Hemoglobin 13.0 - 17.0 g/dL 15.4 12.6(L) 14.2  Hematocrit 39.0 - 52.0 % 45.7 35.4(L) 41.8  Platelets 150 - 400 K/uL 252 301 286   Lipid Panel     Component Value Date/Time   CHOL 214 (H) 07/13/2020 0844   TRIG 80 07/13/2020 0844   HDL 65 07/13/2020 0844   LDLCALC 135 (H) 07/13/2020 0844   A1C 5.900 % 04/23/2020 TSH 2.040 04/23/2020  Hemoglobin 13.400 g/d 04/23/2020  Creatinine, Serum 0.830 mg/ 04/23/2020 Potassium 4.500 mm 04/23/2020 ALT (SGPT) 25.000 U/L 04/23/2020    Labs 07/16/2019: Serum glucose 100 mg, BUN 16, creatinine 0.89, EGFR >60 mL.  Potassium 4.8.  CMP normal. Total cholesterol 218, triglycerides 120, HDL 57, LDL 136, non-HDL cholesterol 160.  Labs 05/08/2018: A1c 6.1%.  Serum glucose 128 mg. BUN 14, creatinine 0.86, EGFR >60 mL.  Potassium 5.6.  CMP otherwise normal.  Total cholesterol scratch that Total cholesterol 208, triglycerides 103, HDL 59, LDL 129, non-HDL cholesterol 149.  Medications and allergies   Allergies  Allergen Reactions  . Livalo [Pitavastatin] Other (See Comments)    Myalgia  . Atorvastatin Other (See Comments)    myalgia    Current Outpatient Medications on File Prior to Visit  Medication Sig Dispense Refill  . aspirin EC 81 MG tablet Take 81 mg by mouth daily.    . cholecalciferol (VITAMIN D3) 25 MCG (1000 UT) tablet Take 1,000 Units by mouth 2 (two) times daily.    . sildenafil (REVATIO) 20 MG tablet Take 20 mg by mouth daily as needed.     No current facility-administered medications on file prior to visit.    Radiology:   Chest x-ray 09/15/2019: Heart size within normal limits. There is no airspace consolidation within the lungs. No evidence of pleural effusion or pneumothorax. No acute bony abnormality. Multilevel thoracic spine anterolateral osteophytes  Cardiac Studies:   Echocardiogram 10/20/2019: Left ventricle cavity is normal in size. Moderate  concentric hypertrophy of the left ventricle. Normal LV systolic function with EF 68%. Normal global wall motion. Doppler evidence of grade I (impaired) diastolic dysfunction, normal LAP.  Left atrial cavity is mildly dilated. Trileaflet aortic valve.  Mild (Grade I) aortic regurgitation. Mild (Grade I) mitral regurgitation. Inadequate TR jet to estimate pulmonary artery systolic pressure. Estimated RA pressure 8 mmHg.  Lexiscan Tetrofosmin Stress Test  10/13/2019: Nondiagnostic ECG stress. Normal myocardial perfusion. All segments of left ventricle demonstrated normal wall motion and thickening. Stress LV EF is hyperdynamic 80%.  Stress LV EF: 80%.  Low risk study. No previous exam available for comparison.  EKG:  EKG 11/17/2020: Normal sinus rhythm at rate of 60 bpm, borderline criteria for left atrial enlargement, normal axis.  No evidence of ischemia, otherwise normal EKG. no significant change from 10/02/2019, inferior nonspecific T abnormality not present.  Assessment     ICD-10-CM   1. Angina pectoris ([place])  I20.9 EKG 12-Lead  2. Essential hypertension  I10   3. Mild hyperlipidemia  E78.5 rosuvastatin (CRESTOR) 10 MG tablet   Meds ordered this encounter  Medications  . rosuvastatin (CRESTOR) 10 MG tablet    Sig: Take 1 tablet (10 mg total) by mouth as directed. Twice weekly, Tue & Thur    Dispense:  30 tablet    Refill:  3   Medications Discontinued During This Encounter  Medication Reason  . Ascorbic Acid (VITAMIN C) 500 MG CAPS Error  . Bempedoic Acid-Ezetimibe (NEXLIZET) 180-10 MG TABS Side effect (s)  . amLODipine (NORVASC) 5 MG tablet Patient Preference  . fluticasone (FLONASE) 50 MCG/ACT nasal spray Patient Preference  . nitroGLYCERIN (NITROSTAT) 0.4 MG SL tablet No longer needed (for PRN medications)     Orders Placed This Encounter  Procedures  . EKG 12-Lead    Recommendations:    Scott Salas  is a 70 y.o. Caucasian male with chronic stable angina  pectoris, chronic dyspnea on exertion, on aggressive medical therapy symptoms have completely resolved.  His cardiovascular risks include family history of premature coronary disease with father had CABG at age 73, hypertension, hyperlipidemia and hyperglycemia.    Patient has discontinued all his medications due to side effects, amlodipine caused him to have severe dizziness and in spite of discontinue the medication for several months home recordings of blood pressure have been under excellent control.  Continues to play heavy tennis almost daily without any limitations with no recurrence of anginal.  With regard to hyperlipidemia, he has not been able to tolerate statins and also discontinued bempedoic acid.  He is willing to try Crestor twice weekly, I will check lipids in 2 months and see him back in 3 months both for follow-up of elevated blood pressure and hyperlipidemia.  Some of the symptoms of fatigue and elevated blood pressure could be related to sleep apnea he has been diagnosed as having mild obstructive sleep apnea by sleep study in September 2021 but still waiting for her CPAP machine to be delivered, I will send a message to Dr. Nehemiah Settle.    Scott Prows, MD, [facility] 11/17/2020, 2:43 PM Office: [phone] Pager: [phone]   CC: Scott Guthrie, MD; Nehemiah Settle (Sleep eval-need for CPAP)

## 2020-11-18 DIAGNOSIS — M1712 Unilateral primary osteoarthritis, left knee: Secondary | ICD-10-CM | POA: Diagnosis not present

## 2020-11-18 DIAGNOSIS — M25562 Pain in left knee: Secondary | ICD-10-CM | POA: Diagnosis not present

## 2020-11-25 ENCOUNTER — Ambulatory Visit: Payer: PPO | Admitting: Cardiology

## 2020-11-25 DIAGNOSIS — M1712 Unilateral primary osteoarthritis, left knee: Secondary | ICD-10-CM | POA: Diagnosis not present

## 2020-11-25 DIAGNOSIS — M25562 Pain in left knee: Secondary | ICD-10-CM | POA: Diagnosis not present

## 2020-12-02 DIAGNOSIS — M1712 Unilateral primary osteoarthritis, left knee: Secondary | ICD-10-CM | POA: Diagnosis not present

## 2021-01-10 DIAGNOSIS — M791 Myalgia, unspecified site: Secondary | ICD-10-CM | POA: Diagnosis not present

## 2021-01-10 DIAGNOSIS — Z125 Encounter for screening for malignant neoplasm of prostate: Secondary | ICD-10-CM | POA: Diagnosis not present

## 2021-01-10 DIAGNOSIS — G4733 Obstructive sleep apnea (adult) (pediatric): Secondary | ICD-10-CM | POA: Diagnosis not present

## 2021-01-10 DIAGNOSIS — E7800 Pure hypercholesterolemia, unspecified: Secondary | ICD-10-CM | POA: Diagnosis not present

## 2021-01-10 DIAGNOSIS — T466X5A Adverse effect of antihyperlipidemic and antiarteriosclerotic drugs, initial encounter: Secondary | ICD-10-CM | POA: Diagnosis not present

## 2021-01-10 DIAGNOSIS — R7303 Prediabetes: Secondary | ICD-10-CM | POA: Diagnosis not present

## 2021-01-10 DIAGNOSIS — I209 Angina pectoris, unspecified: Secondary | ICD-10-CM | POA: Diagnosis not present

## 2021-01-13 DIAGNOSIS — M1712 Unilateral primary osteoarthritis, left knee: Secondary | ICD-10-CM | POA: Diagnosis not present

## 2021-01-31 DIAGNOSIS — G4733 Obstructive sleep apnea (adult) (pediatric): Secondary | ICD-10-CM | POA: Diagnosis not present

## 2021-02-14 ENCOUNTER — Other Ambulatory Visit: Payer: Self-pay

## 2021-02-14 ENCOUNTER — Encounter: Payer: Self-pay | Admitting: Cardiology

## 2021-02-14 ENCOUNTER — Ambulatory Visit: Payer: PPO | Admitting: Cardiology

## 2021-02-14 VITALS — BP 133/79 | HR 57 | Temp 97.7°F | Resp 17 | Ht 70.0 in | Wt 196.6 lb

## 2021-02-14 DIAGNOSIS — E785 Hyperlipidemia, unspecified: Secondary | ICD-10-CM

## 2021-02-14 DIAGNOSIS — I351 Nonrheumatic aortic (valve) insufficiency: Secondary | ICD-10-CM | POA: Diagnosis not present

## 2021-02-14 DIAGNOSIS — R030 Elevated blood-pressure reading, without diagnosis of hypertension: Secondary | ICD-10-CM

## 2021-02-14 NOTE — Progress Notes (Signed)
Primary Physician/Referring:  Vernie Shanks, MD  Patient ID: Scott Salas, male    DOB: [birth year], 70 y.o.   MRN: [id number]  Chief Complaint  Patient presents with  . Follow-up    3 month  . Chest Pain  . Hyperlipidemia  . Hypertension   HPI:    Scott Salas  is a 70 y.o. Caucasian male with chronic stable angina pectoris, chronic dyspnea on exertion, on aggressive medical therapy symptoms have completely resolved.  His cardiovascular risks include family history of premature coronary disease with father had CABG at age 29, hypertension, hyperlipidemia and hyperglycemia.    Initially since treated with statins, he had marked improvement in his lipids and complete resolution of anginal symptoms.  He has not had any recurrence of chest pain and continues to play tennis almost daily without any restrictions.  Presently he is off of all medications including antihypertensive medications as he had noticed marked dizziness and blood pressure at home has been very well controlled.  I brought him back to his be reevaluated in 6 weeks.  He is now using CPAP and states that he feels the best he has in quite a while and has noticed marked improvement in his energy levels as well.  Past Medical History:  Diagnosis Date  . Arthritis   . Chest pain   . Hyperlipidemia    Past Surgical History:  Procedure Laterality Date  . acl left knee     left knee  . KNEE ARTHROSCOPY     right  . TONSILLECTOMY     as child  . TOTAL KNEE ARTHROPLASTY Right 07/21/2013   Procedure: RIGHT TOTAL KNEE ARTHROPLASTY;  Surgeon: Gearlean Alf, MD;  Location: [facility];  Service: Orthopedics;  Laterality: Right;    Social History   Tobacco Use  . Smoking status: Former Smoker    Packs/day: 1.00    Years: 10.00    Pack years: 10.00    Types: Cigarettes    Quit date: 1982    Years since quitting: 40.3  . Smokeless tobacco: Never Used  Substance Use Topics  . Alcohol use: Yes    Comment:  occassionally  Marital Status: Married    ROS  Review of Systems  Cardiovascular: Negative for chest pain, dyspnea on exertion and leg swelling.  Respiratory: Positive for snoring (on CPAP since 01/2021).   Gastrointestinal: Negative for melena.   Objective  Blood pressure 133/79, pulse (!) 57, temperature 97.7 F (36.5 C), temperature source Temporal, resp. rate 17, height _0  (1.778 m), weight 196 lb 9.6 oz (89.2 kg), SpO2 98 %.  Vitals with BMI 02/14/2021 11/17/2020 11/13/2019  Height _1  _2  _3   Weight 196 lbs 10 oz 198 lbs 191 lbs 8 oz  BMI 28.21 94.76 54.65  Systolic 035 465 681  Diastolic 79 85 75  Pulse 57 72 60     Physical Exam Constitutional:      Appearance: He is well-developed.  Neck:     Thyroid: No thyromegaly.     Vascular: No JVD.  Cardiovascular:     Rate and Rhythm: Normal rate and regular rhythm.     Pulses: Normal pulses and intact distal pulses.     Heart sounds: No murmur heard.  Early systolic murmur is present at the upper right sternal border. No gallop.      Comments: No leg edema, no JVD. Pulmonary:     Effort: Pulmonary effort is normal.  Breath sounds: Normal breath sounds.  Abdominal:     General: Bowel sounds are normal.     Palpations: Abdomen is soft.  Musculoskeletal:     Cervical back: Neck supple.  Skin:    General: Skin is warm and dry.  Neurological:     Mental Status: He is alert.    Laboratory examination:   No results for input(s): NA, K, CL, CO2, GLUCOSE, BUN, CREATININE, CALCIUM, GFRNONAA, GFRAA in the last 8760 hours. CrCl cannot be calculated (Patient's most recent lab result is older than the maximum 21 days allowed.).  CMP Latest Ref Rng & Units 09/15/2019 07/22/2013 07/16/2013  Glucose 70 - 99 mg/dL 130(H) 164(H) 94  BUN 8 - 23 mg/dL _0 Creatinine 0.61 - 1.24 mg/dL 0.81 0.61 0.98  Sodium 135 - 145 mmol/L 138 133(L) 139  Potassium 3.5 - 5.1 mmol/L 4.4 4.4 4.9  Chloride 98 - 111 mmol/L 99 99 101   CO2 22 - 32 mmol/L _1 Calcium 8.9 - 10.3 mg/dL 9.7 9.3 9.8  Total Protein 6.0 - 8.3 g/dL - - 7.4  Total Bilirubin 0.3 - 1.2 mg/dL - - 0.4  Alkaline Phos 39 - 117 U/L - - 67  AST 0 - 37 U/L - - 23  ALT 0 - 53 U/L - - 23   CBC Latest Ref Rng & Units 09/15/2019 07/22/2013 07/16/2013  WBC 4.0 - 10.5 K/uL 7.2 17.3(H) 6.8  Hemoglobin 13.0 - 17.0 g/dL 15.4 12.6(L) 14.2  Hematocrit 39.0 - 52.0 % 45.7 35.4(L) 41.8  Platelets 150 - 400 K/uL 252 301 286   Lipid Panel     Component Value Date/Time   CHOL 214 (H) 07/13/2020 0844   TRIG 80 07/13/2020 0844   HDL 65 07/13/2020 0844   LDLCALC 135 (H) 07/13/2020 0844   A1C 5.900 % 04/23/2020 TSH 2.040 04/23/2020  Hemoglobin 13.400 g/d 04/23/2020  Creatinine, Serum 0.830 mg/ 04/23/2020 Potassium 4.500 mm 04/23/2020 ALT (SGPT) 25.000 U/L 04/23/2020    Labs 07/16/2019: Serum glucose 100 mg, BUN 16, creatinine 0.89, EGFR >60 mL.  Potassium 4.8.  CMP normal. Total cholesterol 218, triglycerides 120, HDL 57, LDL 136, non-HDL cholesterol 160.  Labs 05/08/2018: A1c 6.1%.  Serum glucose 128 mg. BUN 14, creatinine 0.86, EGFR >60 mL.  Potassium 5.6.  CMP otherwise normal.  Total cholesterol scratch that Total cholesterol 208, triglycerides 103, HDL 59, LDL 129, non-HDL cholesterol 149.  Medications and allergies   Allergies  Allergen Reactions  . Livalo [Pitavastatin] Other (See Comments)    Myalgia  . Crestor [Rosuvastatin]     Other reaction(s): muscle aches  . Atorvastatin Other (See Comments)    myalgia    Current Outpatient Medications on File Prior to Visit  Medication Sig Dispense Refill  . acetaminophen (TYLENOL) 500 MG tablet Take 500 mg by mouth every 6 (six) hours as needed.    . fluticasone (FLONASE) 50 MCG/ACT nasal spray Place 1 spray into both nostrils as needed.    Marland Kitchen ibuprofen (ADVIL) 100 MG tablet Take 100 mg by mouth every 6 (six) hours as needed for fever.    . sildenafil (REVATIO) 20 MG tablet Take 20 mg by mouth daily as  needed.     No current facility-administered medications on file prior to visit.    Radiology:   Chest x-ray 09/15/2019: Heart size within normal limits. There is no airspace consolidation within the lungs. No evidence of pleural effusion or pneumothorax. No acute bony abnormality. Multilevel  thoracic spine anterolateral osteophytes  Cardiac Studies:   Echocardiogram 10/20/2019: Left ventricle cavity is normal in size. Moderate concentric hypertrophy of the left ventricle. Normal LV systolic function with EF 68%. Normal global wall motion. Doppler evidence of grade I (impaired) diastolic dysfunction, normal LAP.  Left atrial cavity is mildly dilated. Trileaflet aortic valve.  Mild (Grade I) aortic regurgitation. Mild (Grade I) mitral regurgitation. Inadequate TR jet to estimate pulmonary artery systolic pressure. Estimated RA pressure 8 mmHg.  Lexiscan Tetrofosmin Stress Test  10/13/2019: Nondiagnostic ECG stress. Normal myocardial perfusion. All segments of left ventricle demonstrated normal wall motion and thickening. Stress LV EF is hyperdynamic 80%.  Stress LV EF: 80%.  Low risk study. No previous exam available for comparison.  EKG:  EKG 11/17/2020: Normal sinus rhythm at rate of 60 bpm, borderline criteria for left atrial enlargement, normal axis.  No evidence of ischemia, otherwise normal EKG. no significant change from 10/02/2019, inferior nonspecific T abnormality not present.  Assessment     ICD-10-CM   1. Elevated BP without diagnosis of hypertension  R03.0   2. Mild hyperlipidemia  E78.5 CT CARDIAC SCORING (SELF PAY ONLY)  3. Mild aortic regurgitation  I35.1    No orders of the defined types were placed in this encounter.  Medications Discontinued During This Encounter  Medication Reason  . aspirin EC 81 MG tablet Error  . cholecalciferol (VITAMIN D3) 25 MCG (1000 UT) tablet Error  . rosuvastatin (CRESTOR) 10 MG tablet Error     Orders Placed This Encounter   Procedures  . CT CARDIAC SCORING (SELF PAY ONLY)    Standing Status:   Future    Standing Expiration Date:   04/16/2021    Order Specific Question:   Preferred imaging location?    Answer:   [facility]    Order Specific Question:   Radiology Contrast Protocol - do NOT remove file path    Answer:   [email or web address]    Recommendations:    Scott Salas  is a 70 y.o. Caucasian male with chronic stable angina pectoris, chronic dyspnea on exertion, on aggressive medical therapy symptoms have completely resolved.  He had responded very well to statin therapy.  His cardiovascular risks include family history of premature coronary disease with father had CABG at age 11, hypertension, hyperlipidemia and hyperglycemia.    However due to severe myalgias, he has not been able to tolerate any statins at all.  He now presents for office visit for follow-up of hypertension, previously hypertensive but since changing his diet and also using CPAP, his blood pressure is completely normal.  I have taken the diagnosis of hypertension from his chart. Since being on CPAP, he has noticed marked improvement in his overall energy level and also wellbeing.  He is willing to try 1 capsule of red yeast rice at night.  I will perform coronary calcium score, if it is markedly elevated, we could also consider addition of Zetia to this.  If the coronary calcium score is very low or at appropriate level for his age, we could consider not starting him on a statin at all.  He remains angina free, otherwise physical examination is unremarkable.  Unless coronary calcium score is markedly abnormal I will see him back on a as needed basis.     Adrian Prows, MD, [facility] 02/14/2021, 3:11 PM Office: [phone] Pager: [phone]

## 2021-02-15 ENCOUNTER — Other Ambulatory Visit: Payer: Self-pay | Admitting: Student

## 2021-02-16 ENCOUNTER — Other Ambulatory Visit: Payer: Self-pay | Admitting: Student

## 2021-02-16 DIAGNOSIS — E785 Hyperlipidemia, unspecified: Secondary | ICD-10-CM

## 2021-02-28 DIAGNOSIS — G4733 Obstructive sleep apnea (adult) (pediatric): Secondary | ICD-10-CM | POA: Diagnosis not present

## 2021-03-03 ENCOUNTER — Ambulatory Visit
Admission: RE | Admit: 2021-03-03 | Discharge: 2021-03-03 | Disposition: A | Discharge status: Home / Self Care | Payer: PPO | Source: Ambulatory Visit | Attending: Student | Admitting: Student

## 2021-03-03 DIAGNOSIS — E785 Hyperlipidemia, unspecified: Secondary | ICD-10-CM

## 2021-03-08 NOTE — Progress Notes (Signed)
Patient's coronary calcium score is abnormal, in the 93rd percentile for age and sex.  Please inform patient.  Also please schedule patient for follow-up in our office in the next 4 weeks to discuss further. Follow up with CC if pt willing.

## 2021-03-15 NOTE — Progress Notes (Signed)
Called patient, Scott Salas, LMAM

## 2021-03-16 DIAGNOSIS — G4733 Obstructive sleep apnea (adult) (pediatric): Secondary | ICD-10-CM | POA: Diagnosis not present

## 2021-03-31 DIAGNOSIS — G4733 Obstructive sleep apnea (adult) (pediatric): Secondary | ICD-10-CM | POA: Diagnosis not present

## 2021-04-01 DIAGNOSIS — G4733 Obstructive sleep apnea (adult) (pediatric): Secondary | ICD-10-CM | POA: Diagnosis not present

## 2021-04-14 ENCOUNTER — Ambulatory Visit: Payer: PPO | Admitting: Cardiology

## 2021-04-28 ENCOUNTER — Other Ambulatory Visit: Payer: Self-pay

## 2021-04-28 ENCOUNTER — Encounter: Payer: Self-pay | Admitting: Cardiology

## 2021-04-28 ENCOUNTER — Ambulatory Visit: Payer: PPO | Admitting: Cardiology

## 2021-04-28 VITALS — BP 122/77 | HR 57 | Temp 98.2°F | Resp 17 | Ht 70.0 in | Wt 191.8 lb

## 2021-04-28 DIAGNOSIS — I2510 Atherosclerotic heart disease of native coronary artery without angina pectoris: Secondary | ICD-10-CM

## 2021-04-28 DIAGNOSIS — I77810 Thoracic aortic ectasia: Secondary | ICD-10-CM | POA: Diagnosis not present

## 2021-04-28 DIAGNOSIS — R739 Hyperglycemia, unspecified: Secondary | ICD-10-CM

## 2021-04-28 DIAGNOSIS — G4733 Obstructive sleep apnea (adult) (pediatric): Secondary | ICD-10-CM | POA: Diagnosis not present

## 2021-04-28 DIAGNOSIS — E7800 Pure hypercholesterolemia, unspecified: Secondary | ICD-10-CM

## 2021-04-28 DIAGNOSIS — R931 Abnormal findings on diagnostic imaging of heart and coronary circulation: Secondary | ICD-10-CM

## 2021-04-28 MED ORDER — PITAVASTATIN CALCIUM 2 MG PO TABS: 1.0000 | ORAL_TABLET | Freq: Every day | ORAL | 2 refills | Status: DC

## 2021-04-28 NOTE — Progress Notes (Signed)
Primary Physician/Referring:  Vernie Shanks, MD  Patient ID: Scott Salas, male    DOB: Apr 20, 1951, 70 y.o.   MRN: [id number]  Chief Complaint  Patient presents with   Follow-up   ct results   Elevated BP without diagnosis of hypertension   HPI:    Scott Salas  is a 70 y.o. Caucasian male with chronic stable angina pectoris, chronic dyspnea on exertion, on aggressive medical therapy symptoms have completely resolved.  His cardiovascular risks include family history of premature coronary disease with father had CABG at age 41, hypertension, hyperlipidemia and hyperglycemia.    Initially since treated with statins, he had marked improvement in his lipids and complete resolution of anginal symptoms.  He has not had any recurrence of chest pain and continues to play tennis almost daily without any restrictions, bikes 20 to 30 miles at least 2 times a week.  He is now using CPAP and states that he feels the best he has in quite a while and has noticed marked improvement in his energy levels as well.  Underwent coronary calcium scoring presents for follow-up.  Past Medical History:  Diagnosis Date   Arthritis    Chest pain    Hyperlipidemia    Past Surgical History:  Procedure Laterality Date   acl left knee     left knee   KNEE ARTHROSCOPY     right   TONSILLECTOMY     as child   TOTAL KNEE ARTHROPLASTY Right 07/21/2013   Procedure: RIGHT TOTAL KNEE ARTHROPLASTY;  Surgeon: Gearlean Alf, MD;  Location: [facility];  Service: Orthopedics;  Laterality: Right;    Social History   Tobacco Use   Smoking status: Former    Packs/day: 1.00    Years: 10.00    Pack years: 10.00    Types: Cigarettes    Quit date: 1982    Years since quitting: 40.5   Smokeless tobacco: Never  Substance Use Topics   Alcohol use: Yes    Comment: occassionally  Marital Status: Married    ROS  Review of Systems  Cardiovascular:  Negative for chest pain, dyspnea on exertion and leg swelling.   Respiratory:  Positive for snoring (on CPAP since 01/2021).   Gastrointestinal:  Negative for melena.  Objective  Blood pressure 122/77, pulse (!) 57, temperature 98.2 F (36.8 C), temperature source Temporal, resp. rate 17, height '5\' 10"'  (1.778 m), weight 191 lb 12.8 oz (87 kg), SpO2 97 %.  Vitals with BMI 04/28/2021 02/14/2021 11/17/2020  Height '5\' 10"'  '5\' 10"'  '5\' 10"'   Weight 191 lbs 13 oz 196 lbs 10 oz 198 lbs  BMI 27.52 92.11 94.17  Systolic 408 144 818  Diastolic 77 79 85  Pulse 57 57 72     Physical Exam Constitutional:      Appearance: He is well-developed.  Neck:     Thyroid: No thyromegaly.     Vascular: No JVD.  Cardiovascular:     Rate and Rhythm: Normal rate and regular rhythm.     Pulses: Normal pulses and intact distal pulses.     Heart sounds: No murmur heard. Early systolic murmur is present at the upper right sternal border.    No gallop.     Comments: No leg edema, no JVD. Pulmonary:     Effort: Pulmonary effort is normal.     Breath sounds: Normal breath sounds.  Abdominal:     General: Bowel sounds are normal.     Palpations:  Abdomen is soft.  Musculoskeletal:     Cervical back: Neck supple.  Skin:    General: Skin is warm and dry.  Neurological:     Mental Status: He is alert.   Laboratory examination:   No results for input(s): NA, K, CL, CO2, GLUCOSE, BUN, CREATININE, CALCIUM, GFRNONAA, GFRAA in the last 8760 hours. CrCl cannot be calculated (Patient's most recent lab result is older than the maximum 21 days allowed.).  CMP Latest Ref Rng & Units 09/15/2019 07/22/2013 07/16/2013  Glucose 70 - 99 mg/dL 130(H) 164(H) 94  BUN 8 - 23 mg/dL '15 12 17  ' Creatinine 0.61 - 1.24 mg/dL 0.81 0.61 0.98  Sodium 135 - 145 mmol/L 138 133(L) 139  Potassium 3.5 - 5.1 mmol/L 4.4 4.4 4.9  Chloride 98 - 111 mmol/L 99 99 101  CO2 22 - 32 mmol/L '27 23 29  ' Calcium 8.9 - 10.3 mg/dL 9.7 9.3 9.8  Total Protein 6.0 - 8.3 g/dL - - 7.4  Total Bilirubin 0.3 - 1.2 mg/dL - - 0.4   Alkaline Phos 39 - 117 U/L - - 67  AST 0 - 37 U/L - - 23  ALT 0 - 53 U/L - - 23   CBC Latest Ref Rng & Units 09/15/2019 07/22/2013 07/16/2013  WBC 4.0 - 10.5 K/uL 7.2 17.3(H) 6.8  Hemoglobin 13.0 - 17.0 g/dL 15.4 12.6(L) 14.2  Hematocrit 39.0 - 52.0 % 45.7 35.4(L) 41.8  Platelets 150 - 400 K/uL 252 301 286   Lipid Panel     Component Value Date/Time   CHOL 214 (H) 07/13/2020 0844   TRIG 80 07/13/2020 0844   HDL 65 07/13/2020 0844   LDLCALC 135 (H) 07/13/2020 0844   A1C 5.900 % 04/23/2020 TSH 2.040 04/23/2020  Hemoglobin 13.400 g/d 04/23/2020  Creatinine, Serum 0.830 mg/ 04/23/2020 Potassium 4.500 mm 04/23/2020 ALT (SGPT) 25.000 U/L 04/23/2020    Labs 07/16/2019: Serum glucose 100 mg, BUN 16, creatinine 0.89, EGFR >60 mL.  Potassium 4.8.  CMP normal. Total cholesterol 218, triglycerides 120, HDL 57, LDL 136, non-HDL cholesterol 160.  Labs 05/08/2018: A1c 6.1%.  Serum glucose 128 mg. BUN 14, creatinine 0.86, EGFR >60 mL.  Potassium 5.6.  CMP otherwise normal.  Total cholesterol scratch that Total cholesterol 208, triglycerides 103, HDL 59, LDL 129, non-HDL cholesterol 149.  Medications and allergies   Allergies  Allergen Reactions   Livalo [Pitavastatin] Other (See Comments)    Myalgia   Crestor [Rosuvastatin]     Other reaction(s): muscle aches   Atorvastatin Other (See Comments)    myalgia    Current Outpatient Medications on File Prior to Visit  Medication Sig Dispense Refill   acetaminophen (TYLENOL) 500 MG tablet Take 500 mg by mouth every 6 (six) hours as needed.     fluticasone (FLONASE) 50 MCG/ACT nasal spray Place 1 spray into both nostrils as needed.     ibuprofen (ADVIL) 100 MG tablet Take 100 mg by mouth every 6 (six) hours as needed for fever.     sildenafil (REVATIO) 20 MG tablet Take 20 mg by mouth daily as needed.     No current facility-administered medications on file prior to visit.    Radiology:   Chest x-ray 09/15/2019: Heart size within normal  limits. There is no airspace consolidation within the lungs. No evidence of pleural effusion or pneumothorax. No acute bony abnormality. Multilevel thoracic spine anterolateral osteophytes  Cardiac Studies:   Echocardiogram 10/20/2019: Left ventricle cavity is normal in size. Moderate concentric hypertrophy of  the left ventricle. Normal LV systolic function with EF 68%. Normal global wall motion. Doppler evidence of grade I (impaired) diastolic dysfunction, normal LAP.  Left atrial cavity is mildly dilated. Trileaflet aortic valve.  Mild (Grade I) aortic regurgitation. Mild (Grade I) mitral regurgitation. Inadequate TR jet to estimate pulmonary artery systolic pressure. Estimated RA pressure 8 mmHg.  Lexiscan Tetrofosmin Stress Test  10/13/2019: Nondiagnostic ECG stress. Normal myocardial perfusion. All segments of left ventricle demonstrated normal wall motion and thickening. Stress LV EF is hyperdynamic 80%.  Stress LV EF: 80%.  Low risk study. No previous exam available for comparison.  Coronary calcium score 03/03/2020: LM: 86 LAD: 356 LCx: 101 RCA: 75. Total Agatston score 619, MESA database percentile 93. Ascending aorta mildly enlarged at 43 mm.  Visualized noncardiac structures within normal limits.  EKG:  EKG 11/17/2020: Normal sinus rhythm at rate of 60 bpm, borderline criteria for left atrial enlargement, normal axis.  No evidence of ischemia, otherwise normal EKG. no significant change from 10/02/2019, inferior nonspecific T abnormality not present.  Assessment     ICD-10-CM   1. Coronary artery disease involving native coronary artery of native heart without angina pectoris  I25.10 Pitavastatin Calcium 2 MG TABS    PCV ECHOCARDIOGRAM COMPLETE    Lipid Panel With LDL/HDL Ratio    Lipoprotein A (LPA)    2. Agatston CAC score, >400  R93.1     3. Hyperglycemia  R73.9     4. Hypercholesteremia  E78.00 Pitavastatin Calcium 2 MG TABS    Lipid Panel With LDL/HDL Ratio     Lipoprotein A (LPA)    5. OSA (obstructive sleep apnea) on CPAP  G47.33     6. Aortic root dilatation (HCC)  I77.810 PCV ECHOCARDIOGRAM COMPLETE     Meds ordered this encounter  Medications   Pitavastatin Calcium 2 MG TABS    Sig: Take 1 tablet (2 mg total) by mouth daily.    Dispense:  30 tablet    Refill:  2    There are no discontinued medications.    Orders Placed This Encounter  Procedures   Lipid Panel With LDL/HDL Ratio    Standing Status:   Future    Standing Expiration Date:   04/28/2022   Lipoprotein A (LPA)    Standing Status:   Future    Standing Expiration Date:   10/29/2021   PCV ECHOCARDIOGRAM COMPLETE    Standing Status:   Future    Standing Expiration Date:   04/28/2022     Recommendations:    Scott Salas  is a 71 y.o. Caucasian male with chronic stable angina pectoris, chronic dyspnea on exertion, on aggressive medical therapy symptoms have completely resolved.  He had responded very well to statin therapy.  His cardiovascular risks include family history of premature coronary disease with father had CABG at age 41, hypertension, hyperlipidemia and hyperglycemia.    However due to severe myalgias, he has not been able to tolerate any statins at all.  I reviewed the results of the coronary CT calcium score, markedly elevated and high risk.  Patient clearly has underlying coronary artery disease but is without any anginal symptoms, continues to bike for 20 to 30 miles at least 3 times a week without any chest pain or dyspnea.  Patient now wants to try statins again, states that now that he is on CPAP, he feels well rested and energetic and hence he thinks that fatigue and myalgias may have been related to poor  sleeping habits.  I will start him on Livalo, 2 mg Rx sent, patient will start with 1 mg daily.  Goal LDL closer to 70 mg.  I will check lipids along with LPA, previously LPA was ordered however was not drawn.  We will perform this in 3 months.  Unless  he has new symptoms, I will see him back in 6 months for follow-up.  He also has aortic root dilatation by CT measuring 4.3 cm, will obtain an echocardiogram to have a baseline.  Blood pressure is well controlled, I do not want to challenge him with multiple medications, losartan would be a good choice for aortic root dilatation.  We will try to implement this on his next office visit.     Adrian Prows, MD, [facility] 04/28/2021, 11:31 AM Office: [phone] Pager: [phone]

## 2021-04-30 DIAGNOSIS — G4733 Obstructive sleep apnea (adult) (pediatric): Secondary | ICD-10-CM | POA: Diagnosis not present

## 2021-06-06 DIAGNOSIS — G4733 Obstructive sleep apnea (adult) (pediatric): Secondary | ICD-10-CM | POA: Diagnosis not present

## 2021-06-09 NOTE — Telephone Encounter (Signed)
From pt

## 2021-07-03 IMAGING — CT CT CARDIAC CORONARY ARTERY CALCIUM SCORE
3 series · 13 of 20 positions shown, 15 images · non-contrast
Comparison: None.

CLINICAL DATA: Hyperlipidemia

EXAM:
CT CARDIAC CORONARY ARTERY CALCIUM SCORE
TECHNIQUE: Non-contrast imaging through the heart was performed using
prospective ECG gating. Image post processing was performed on an
independent workstation, allowing for quantitative analysis of the
heart and coronary arteries. Note that this exam targets the heart
and the chest was not imaged in its entirety.

[Series 2: calcium scoring 2.00 qr36 bestdiast 68% hrt calciu · axial · 0.39mm/px · z∈[+1407,+1459]mm · 3 of 66 slices shown]
[im 14/66  vessel]
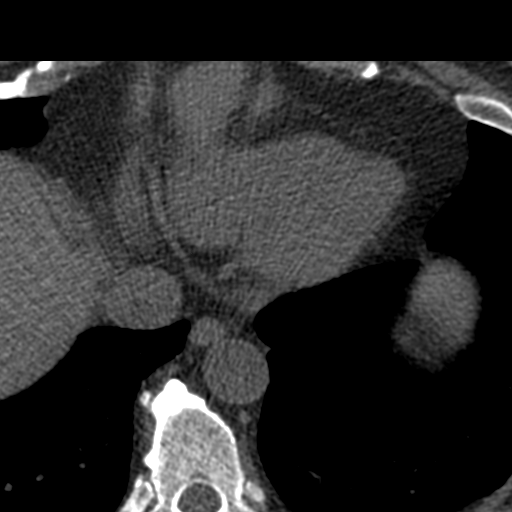
[im 27/66  vessel]
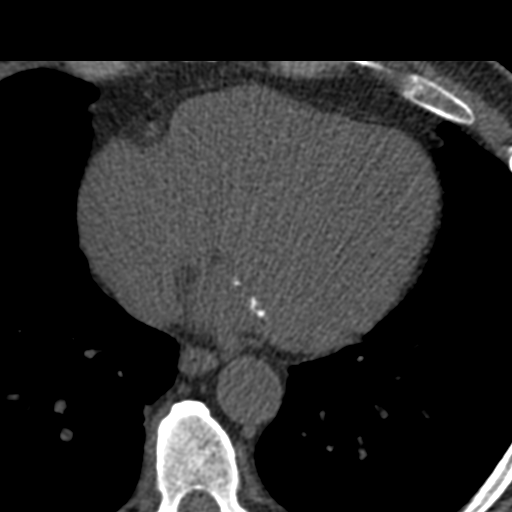
[im 40/66  vessel]
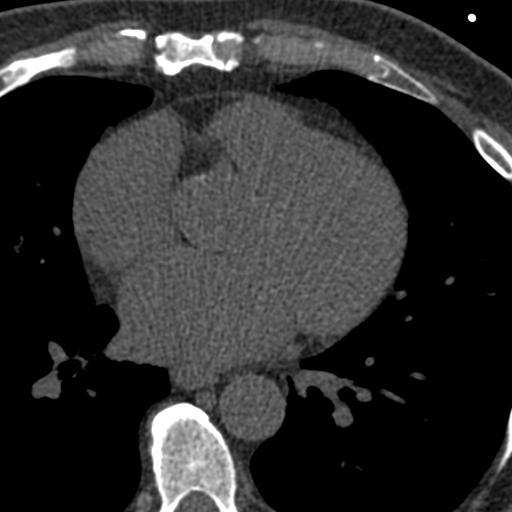

[Series 3: calcium scoring 2.00 br40 bestdiast 68% axial · axial · 0.55mm/px · z∈[+1401,+1489]mm · 5 of 66 slices shown, 7 images]
[im 11/66  vessel]
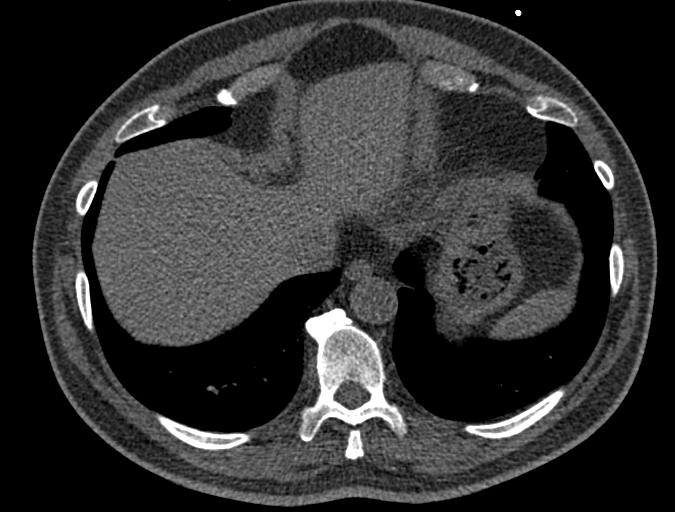
[im 11/66  lung]
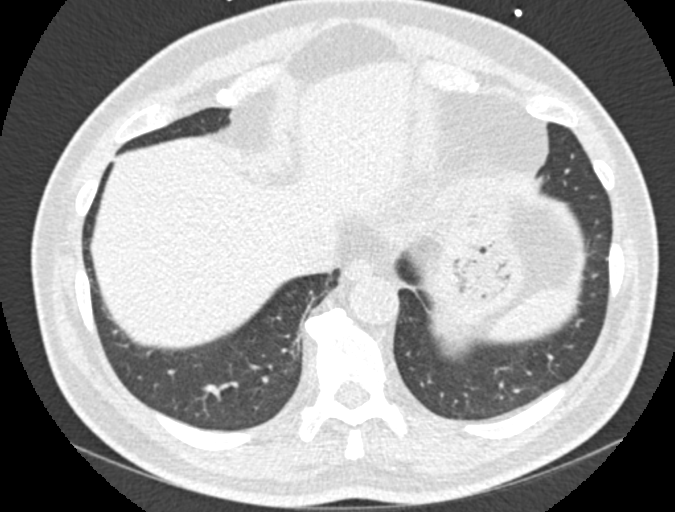
[im 22/66  vessel]
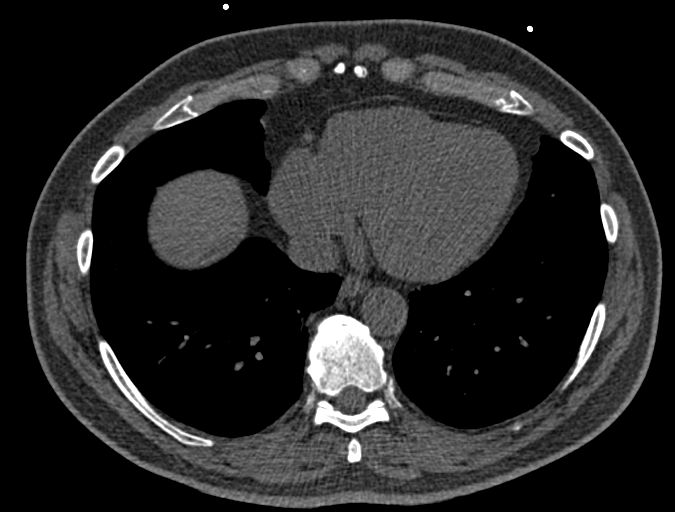
[im 33/66  vessel]
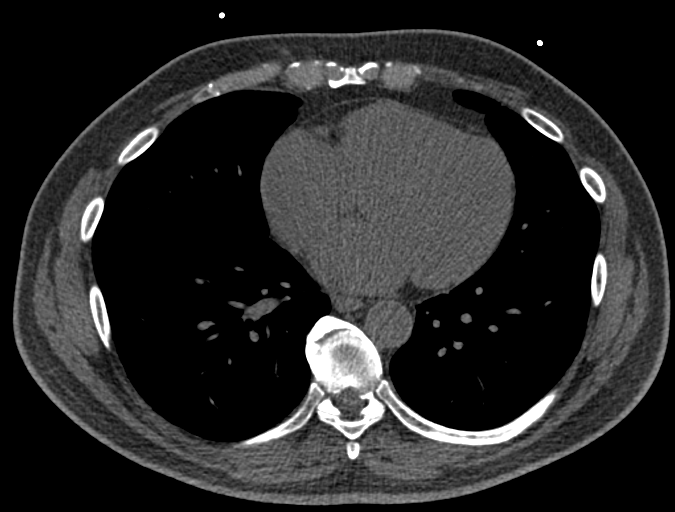
[im 44/66  vessel]
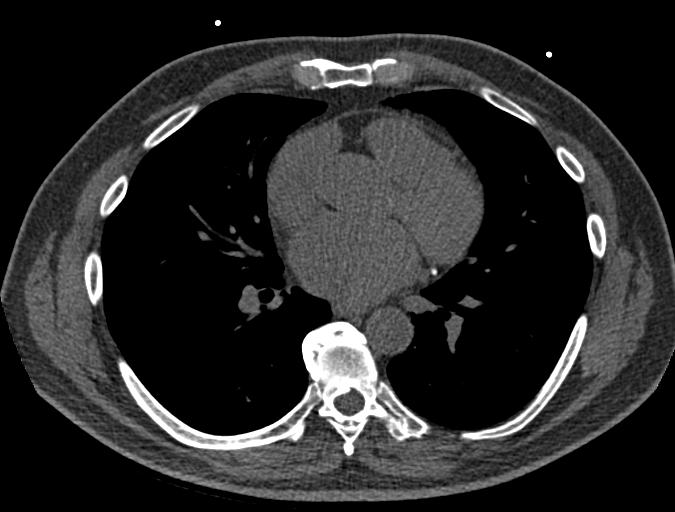
[im 55/66  vessel]
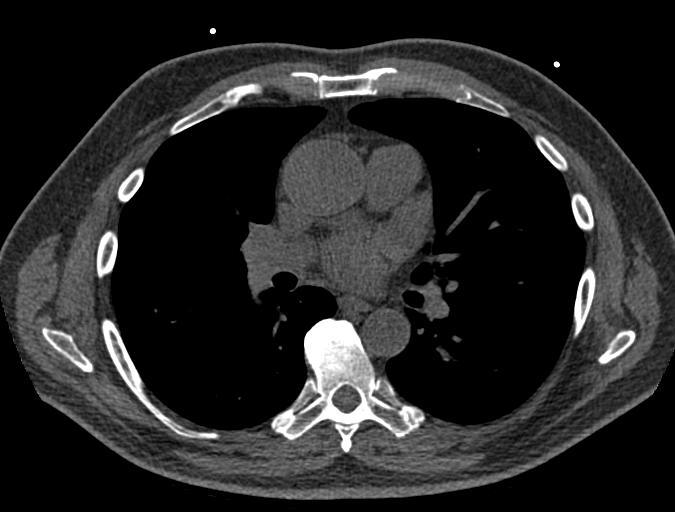
[im 55/66  lung]
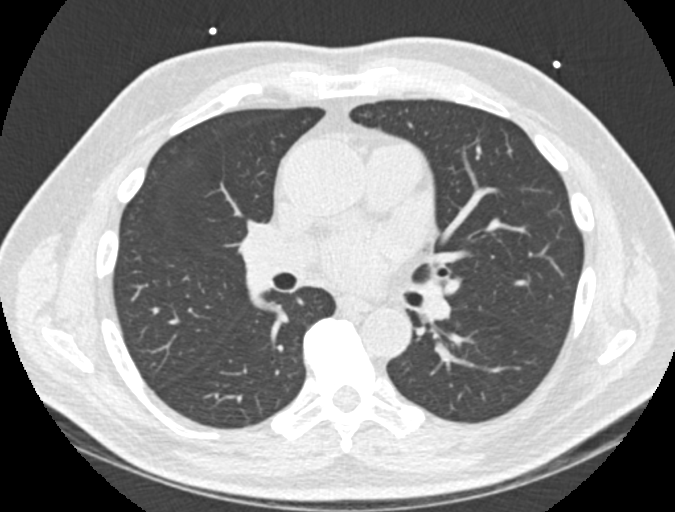

[Series 9: calcium scoring 2.00 br60 bestdiast 68% lungs · axial · 0.55mm/px · z∈[+1401,+1489]mm · 5 of 66 slices shown]
[im 11/66  vessel]
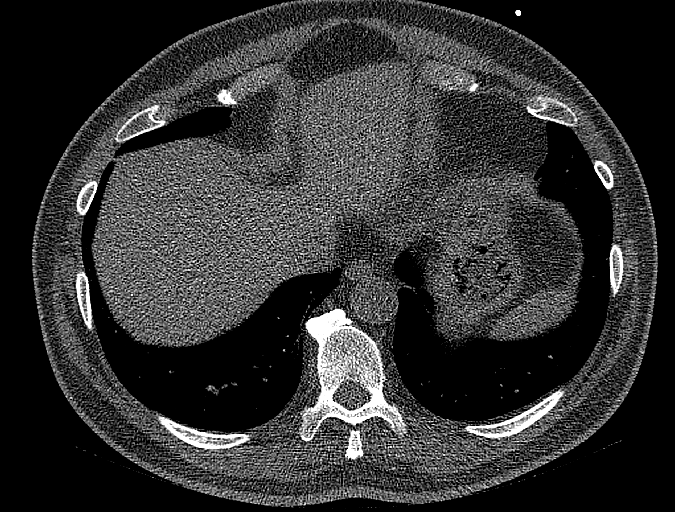
[im 22/66  vessel]
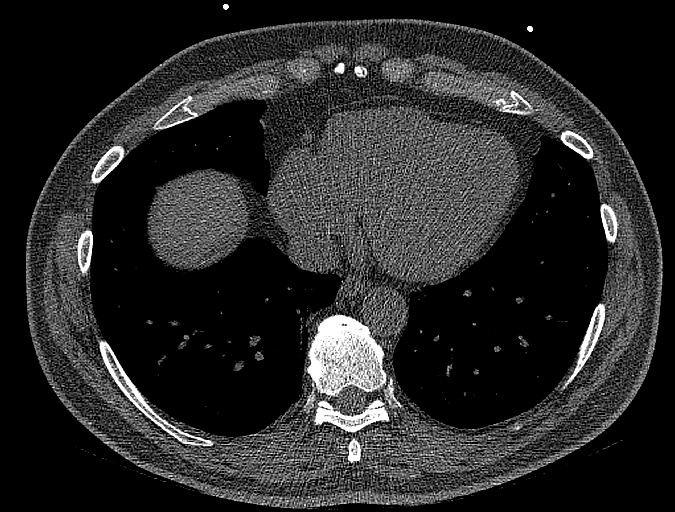
[im 33/66  vessel]
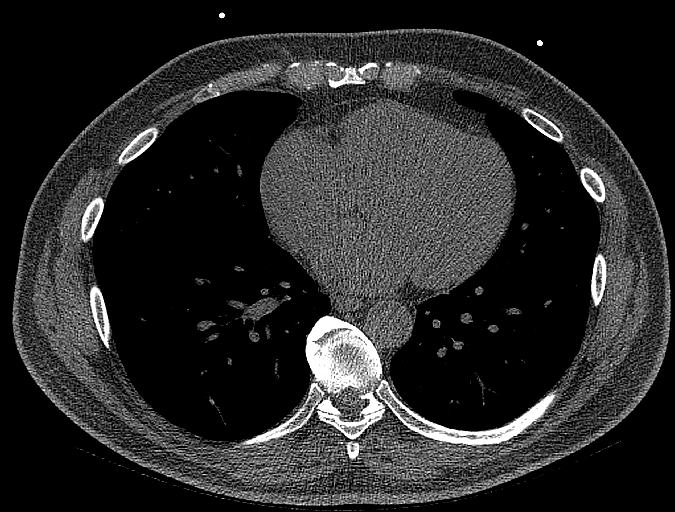
[im 44/66  vessel]
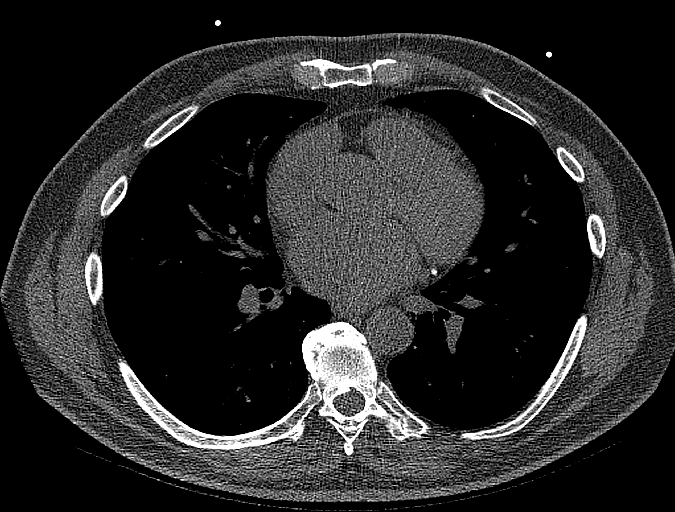
[im 55/66  vessel]
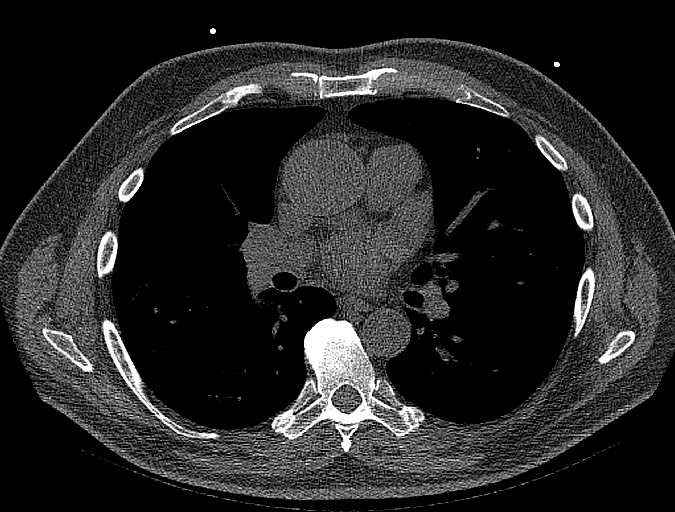

[13 of 20 positions shown; findings below may reference images not displayed]

FINDINGS: CORONARY CALCIUM SCORES:

Left Main: 86

LAD: 356

LCx: 101

RCA: 75

Total Agatston Score: 619

[HOSPITAL] percentile: 93

AORTA MEASUREMENTS:

Ascending Aorta: 43 mm

Descending Aorta: 26 mm

OTHER FINDINGS:

Heart is normal size. Aneurysmal dilatation of the ascending
thoracic aorta. Scattered calcifications in the aortic root. No
adenopathy. Visualized lungs clear. No effusions. Imaging into the
upper abdomen demonstrates no acute findings. Chest wall soft
tissues are unremarkable. No acute bony abnormality.
IMPRESSION: Total Agatston score: 619

[HOSPITAL] percentile: 93

Is 4.3 cm ascending thoracic aortic aneurysm. Recommend annual
imaging followup by CTA or MRA. This recommendation follows 0848
ACCF/AHA/AATS/ACR/ASA/SCA/RUDI/CARMAN/GIO/MELLANIE Guidelines for the
Diagnosis and Management of Patients with Thoracic Aortic Disease.
Circulation. 0848; 121: [id number]. Aortic aneurysm NOS ([id number])

## 2021-07-09 DIAGNOSIS — H66001 Acute suppurative otitis media without spontaneous rupture of ear drum, right ear: Secondary | ICD-10-CM | POA: Diagnosis not present

## 2021-07-12 DIAGNOSIS — I2510 Atherosclerotic heart disease of native coronary artery without angina pectoris: Secondary | ICD-10-CM | POA: Diagnosis not present

## 2021-07-12 DIAGNOSIS — E7800 Pure hypercholesterolemia, unspecified: Secondary | ICD-10-CM | POA: Diagnosis not present

## 2021-07-13 LAB — LIPID PANEL WITH LDL/HDL RATIO
Cholesterol, Total: 168 mg/dL (ref 100–199)
HDL: 54 mg/dL (ref >39)
LDL Chol Calc (NIH): 93 mg/dL (ref 0–99)
LDL/HDL Ratio: 1.7 ratio (ref 0.0–3.6)
Triglycerides: 115 mg/dL (ref 0–149)
VLDL Cholesterol Cal: 21 mg/dL (ref 5–40)

## 2021-07-13 LAB — LIPOPROTEIN A (LPA): Lipoprotein (a): 38.1 nmol/L (ref <75.0)

## 2021-07-13 NOTE — Progress Notes (Signed)
Seeing soon

## 2021-07-14 DIAGNOSIS — G4733 Obstructive sleep apnea (adult) (pediatric): Secondary | ICD-10-CM | POA: Diagnosis not present

## 2021-07-14 DIAGNOSIS — E7800 Pure hypercholesterolemia, unspecified: Secondary | ICD-10-CM | POA: Diagnosis not present

## 2021-07-14 DIAGNOSIS — T466X5A Adverse effect of antihyperlipidemic and antiarteriosclerotic drugs, initial encounter: Secondary | ICD-10-CM | POA: Diagnosis not present

## 2021-07-14 DIAGNOSIS — I209 Angina pectoris, unspecified: Secondary | ICD-10-CM | POA: Diagnosis not present

## 2021-07-14 DIAGNOSIS — R7303 Prediabetes: Secondary | ICD-10-CM | POA: Diagnosis not present

## 2021-07-20 ENCOUNTER — Other Ambulatory Visit: Payer: Self-pay

## 2021-07-20 ENCOUNTER — Encounter: Payer: Self-pay | Admitting: Student

## 2021-07-20 ENCOUNTER — Ambulatory Visit: Payer: PPO | Admitting: Student

## 2021-07-20 VITALS — BP 125/70 | HR 63 | Temp 97.6°F | Ht 70.0 in | Wt 183.0 lb

## 2021-07-20 DIAGNOSIS — I2510 Atherosclerotic heart disease of native coronary artery without angina pectoris: Secondary | ICD-10-CM

## 2021-07-20 DIAGNOSIS — E7800 Pure hypercholesterolemia, unspecified: Secondary | ICD-10-CM | POA: Diagnosis not present

## 2021-07-20 DIAGNOSIS — R931 Abnormal findings on diagnostic imaging of heart and coronary circulation: Secondary | ICD-10-CM

## 2021-07-20 MED ORDER — REPATHA 140 MG/ML ~~LOC~~ SOSY: 1.0000 mL | PREFILLED_SYRINGE | SUBCUTANEOUS | 6 refills | Status: DC

## 2021-07-20 MED ORDER — EVOLOCUMAB 140 MG/ML ~~LOC~~ SOAJ
140.0000 mg | Freq: Once | SUBCUTANEOUS | Status: AC
  Administered 2021-07-20: 140 mg via SUBCUTANEOUS

## 2021-07-20 NOTE — Progress Notes (Signed)
Primary Physician/Referring:  Vernie Shanks, MD  Patient ID: Scott Salas, male    DOB: 15-Nov-1950, 70 y.o.   MRN: [id number]  Chief Complaint  Patient presents with   Coronary Artery Disease   Results   HPI:    Scott Salas  is a 70 y.o. Caucasian male with chronic stable angina pectoris, chronic dyspnea on exertion, on aggressive medical therapy symptoms have completely resolved.  His cardiovascular risks include family history of premature coronary disease with father had CABG at age 25, hypertension, hyperlipidemia and hyperglycemia.  At last office visit rechallenged statin therapy with Livalo, however patient was unable to tolerate due to myalgias.  He remains compliant with CPAP and overall is feeling relatively well. He has echocardiogram scheduled later this month.  Past Medical History:  Diagnosis Date   Arthritis    Chest pain    Hyperlipidemia    Past Surgical History:  Procedure Laterality Date   acl left knee     left knee   KNEE ARTHROSCOPY     right   TONSILLECTOMY     as child   TOTAL KNEE ARTHROPLASTY Right 07/21/2013   Procedure: RIGHT TOTAL KNEE ARTHROPLASTY;  Surgeon: Gearlean Alf, MD;  Location: [facility];  Service: Orthopedics;  Laterality: Right;   Family History  Problem Relation Age of Onset   Heart failure Father    Heart disease Father    Diabetes Father    Cancer Maternal Grandfather    Hypertension Mother    Social History   Tobacco Use   Smoking status: Former    Packs/day: 1.00    Years: 10.00    Pack years: 10.00    Types: Cigarettes    Quit date: 1982    Years since quitting: 40.7   Smokeless tobacco: Never  Substance Use Topics   Alcohol use: Yes    Comment: occassionally  Marital Status: Married    ROS  Review of Systems  Constitutional: Negative for malaise/fatigue and weight gain.  Cardiovascular:  Negative for chest pain, claudication, dyspnea on exertion, leg swelling, near-syncope, orthopnea, palpitations,  paroxysmal nocturnal dyspnea and syncope.  Respiratory:  Positive for shortness of breath (mild in AM) and snoring (on CPAP since 01/2021).   Gastrointestinal:  Negative for melena.  Neurological:  Negative for dizziness.  Objective  Blood pressure 125/70, pulse 63, temperature 97.6 F (36.4 C), temperature source Temporal, height '5\' 10"'  (1.778 m), weight 183 lb (83 kg), SpO2 99 %.  Vitals with BMI 07/20/2021 04/28/2021 02/14/2021  Height '5\' 10"'  '5\' 10"'  '5\' 10"'   Weight 183 lbs 191 lbs 13 oz 196 lbs 10 oz  BMI 26.26 18.29 93.71  Systolic 696 789 381  Diastolic 70 77 79  Pulse 63 57 57     Physical Exam Vitals reviewed.  Constitutional:      Appearance: He is well-developed.  Neck:     Thyroid: No thyromegaly.     Vascular: No JVD.  Cardiovascular:     Rate and Rhythm: Normal rate and regular rhythm.     Pulses: Normal pulses and intact distal pulses.     Heart sounds: No murmur heard. Early systolic murmur is present at the upper right sternal border.    No gallop.     Comments: No leg edema, no JVD. Pulmonary:     Effort: Pulmonary effort is normal.     Breath sounds: Normal breath sounds.  Skin:    General: Skin is warm and dry.  Neurological:     Mental Status: He is alert.   Laboratory examination:   No results for input(s): NA, K, CL, CO2, GLUCOSE, BUN, CREATININE, CALCIUM, GFRNONAA, GFRAA in the last 8760 hours. CrCl cannot be calculated (Patient's most recent lab result is older than the maximum 21 days allowed.).  CMP Latest Ref Rng & Units 09/15/2019 07/22/2013 07/16/2013  Glucose 70 - 99 mg/dL 130(H) 164(H) 94  BUN 8 - 23 mg/dL '15 12 17  ' Creatinine 0.61 - 1.24 mg/dL 0.81 0.61 0.98  Sodium 135 - 145 mmol/L 138 133(L) 139  Potassium 3.5 - 5.1 mmol/L 4.4 4.4 4.9  Chloride 98 - 111 mmol/L 99 99 101  CO2 22 - 32 mmol/L '27 23 29  ' Calcium 8.9 - 10.3 mg/dL 9.7 9.3 9.8  Total Protein 6.0 - 8.3 g/dL - - 7.4  Total Bilirubin 0.3 - 1.2 mg/dL - - 0.4  Alkaline Phos 39 - 117  U/L - - 67  AST 0 - 37 U/L - - 23  ALT 0 - 53 U/L - - 23   CBC Latest Ref Rng & Units 09/15/2019 07/22/2013 07/16/2013  WBC 4.0 - 10.5 K/uL 7.2 17.3(H) 6.8  Hemoglobin 13.0 - 17.0 g/dL 15.4 12.6(L) 14.2  Hematocrit 39.0 - 52.0 % 45.7 35.4(L) 41.8  Platelets 150 - 400 K/uL 252 301 286   Lipid Panel     Component Value Date/Time   CHOL 168 07/12/2021 0730   TRIG 115 07/12/2021 0730   HDL 54 07/12/2021 0730   LDLCALC 93 07/12/2021 0730   A1C 5.900 % 04/23/2020 TSH 2.040 04/23/2020  Hemoglobin 13.400 g/d 04/23/2020  Creatinine, Serum 0.830 mg/ 04/23/2020 Potassium 4.500 mm 04/23/2020 ALT (SGPT) 25.000 U/L 04/23/2020    Labs 07/16/2019: Serum glucose 100 mg, BUN 16, creatinine 0.89, EGFR >60 mL.  Potassium 4.8.  CMP normal. Total cholesterol 218, triglycerides 120, HDL 57, LDL 136, non-HDL cholesterol 160.  Labs 05/08/2018: A1c 6.1%.  Serum glucose 128 mg. BUN 14, creatinine 0.86, EGFR >60 mL.  Potassium 5.6.  CMP otherwise normal.  Total cholesterol scratch that Total cholesterol 208, triglycerides 103, HDL 59, LDL 129, non-HDL cholesterol 149. Allergies   Allergies  Allergen Reactions   Livalo [Pitavastatin] Other (See Comments)    Myalgia   Crestor [Rosuvastatin]     Other reaction(s): muscle aches   Atorvastatin Other (See Comments)    myalgia      Medications Prior to Visit:   Outpatient Medications Prior to Visit  Medication Sig Dispense Refill   acetaminophen (TYLENOL) 500 MG tablet Take 500 mg by mouth every 6 (six) hours as needed.     fluticasone (FLONASE) 50 MCG/ACT nasal spray Place 1 spray into both nostrils as needed.     ibuprofen (ADVIL) 100 MG tablet Take 100 mg by mouth every 6 (six) hours as needed for fever.     sildenafil (REVATIO) 20 MG tablet Take 20 mg by mouth daily as needed.     Pitavastatin Calcium 2 MG TABS Take 1 tablet (2 mg total) by mouth daily. (Patient not taking: Reported on 07/20/2021) 30 tablet 2   No facility-administered medications prior to  visit.   Final Medications at End of Visit    Current Meds  Medication Sig   acetaminophen (TYLENOL) 500 MG tablet Take 500 mg by mouth every 6 (six) hours as needed.   fluticasone (FLONASE) 50 MCG/ACT nasal spray Place 1 spray into both nostrils as needed.   ibuprofen (ADVIL) 100 MG tablet Take 100  mg by mouth every 6 (six) hours as needed for fever.   sildenafil (REVATIO) 20 MG tablet Take 20 mg by mouth daily as needed.   [DISCONTINUED] Evolocumab (REPATHA) 140 MG/ML SOSY Inject 1 mL into the skin every 14 (fourteen) days.    Radiology:   Chest x-ray 09/15/2019: Heart size within normal limits. There is no airspace consolidation within the lungs. No evidence of pleural effusion or pneumothorax. No acute bony abnormality. Multilevel thoracic spine anterolateral osteophytes  Cardiac Studies:   Echocardiogram 10/20/2019: Left ventricle cavity is normal in size. Moderate concentric hypertrophy of the left ventricle. Normal LV systolic function with EF 68%. Normal global wall motion. Doppler evidence of grade I (impaired) diastolic dysfunction, normal LAP.  Left atrial cavity is mildly dilated. Trileaflet aortic valve.  Mild (Grade I) aortic regurgitation. Mild (Grade I) mitral regurgitation. Inadequate TR jet to estimate pulmonary artery systolic pressure. Estimated RA pressure 8 mmHg.  Lexiscan Tetrofosmin Stress Test  10/13/2019: Nondiagnostic ECG stress. Normal myocardial perfusion. All segments of left ventricle demonstrated normal wall motion and thickening. Stress LV EF is hyperdynamic 80%.  Stress LV EF: 80%.  Low risk study. No previous exam available for comparison.  Coronary calcium score 03/03/2020: LM: 86 LAD: 356 LCx: 101 RCA: 75. Total Agatston score 619, MESA database percentile 93. Ascending aorta mildly enlarged at 43 mm.  Visualized noncardiac structures within normal limits.  EKG:  EKG 11/17/2020: Normal sinus rhythm at rate of 60 bpm, borderline criteria  for left atrial enlargement, normal axis.  No evidence of ischemia, otherwise normal EKG. no significant change from 10/02/2019, inferior nonspecific T abnormality not present.  Assessment     ICD-10-CM   1. Agatston CAC score, >400  R93.1 Lipid Panel With LDL/HDL Ratio    Lipid Panel With LDL/HDL Ratio    Evolocumab SOAJ 140 mg    2. Coronary artery disease involving native coronary artery of native heart without angina pectoris  I25.10 Lipid Panel With LDL/HDL Ratio    Lipid Panel With LDL/HDL Ratio    Evolocumab SOAJ 140 mg    3. Hypercholesteremia  E78.00 Lipid Panel With LDL/HDL Ratio    Lipid Panel With LDL/HDL Ratio    Evolocumab SOAJ 140 mg     Meds ordered this encounter  Medications   DISCONTD: Evolocumab (REPATHA) 140 MG/ML SOSY    Sig: Inject 1 mL into the skin every 14 (fourteen) days.    Dispense:  2.1 mL    Refill:  6   Evolocumab SOAJ 140 mg   Evolocumab (REPATHA) 140 MG/ML SOSY    Sig: Inject 1 mL into the skin every 14 (fourteen) days.    Dispense:  2.1 mL    Refill:  6    Medications Discontinued During This Encounter  Medication Reason   Pitavastatin Calcium 2 MG TABS Side effect (s)   Evolocumab (REPATHA) 140 MG/ML SOSY Reorder      Orders Placed This Encounter  Procedures   Lipid Panel With LDL/HDL Ratio    Standing Status:   Future    Number of Occurrences:   1    Standing Expiration Date:   01/18/2022     Recommendations:    Scott Salas  is a 70 y.o. Caucasian male with chronic stable angina pectoris, chronic dyspnea on exertion, on aggressive medical therapy symptoms have completely resolved.  He had responded very well to statin therapy.  His cardiovascular risks include family history of premature coronary disease with father had CABG at  age 48, hypertension, hyperlipidemia and hyperglycemia.    At last office visit rechallenged statin therapy with Livalo, however patient was unable to tolerate due to myalgias.  Reviewed with patient  recent lipid profile testing which reveals LDL still >70, LPA normal.  Given coronary calcium scoring with patient in 93rd percentile recommend aggressive lipid management.  We will discontinue statin therapy and switch patient to [place].  We will repeat lipid profile testing in 3 months.  Patient will keep previously scheduled appointment with Dr. Einar Gip.   Alethia Berthold, PA-C 07/20/2021, 4:47 PM Office: [phone]

## 2021-07-21 ENCOUNTER — Other Ambulatory Visit: Payer: Self-pay

## 2021-07-21 DIAGNOSIS — M1712 Unilateral primary osteoarthritis, left knee: Secondary | ICD-10-CM | POA: Diagnosis not present

## 2021-07-27 ENCOUNTER — Telehealth: Payer: Self-pay

## 2021-07-27 ENCOUNTER — Other Ambulatory Visit: Payer: Self-pay

## 2021-07-27 MED ORDER — REPATHA 140 MG/ML ~~LOC~~ SOSY: 1.0000 mL | PREFILLED_SYRINGE | SUBCUTANEOUS | 3 refills | Status: DC

## 2021-07-27 NOTE — Telephone Encounter (Signed)
Patient called and left a VM I called him back but phone was disconnected. Not sure why he called

## 2021-07-28 DIAGNOSIS — H6591 Unspecified nonsuppurative otitis media, right ear: Secondary | ICD-10-CM | POA: Diagnosis not present

## 2021-07-28 DIAGNOSIS — M1712 Unilateral primary osteoarthritis, left knee: Secondary | ICD-10-CM | POA: Diagnosis not present

## 2021-07-28 DIAGNOSIS — H6981 Other specified disorders of Eustachian tube, right ear: Secondary | ICD-10-CM | POA: Diagnosis not present

## 2021-08-04 DIAGNOSIS — M1712 Unilateral primary osteoarthritis, left knee: Secondary | ICD-10-CM | POA: Diagnosis not present

## 2021-08-08 ENCOUNTER — Other Ambulatory Visit: Payer: Self-pay

## 2021-08-08 ENCOUNTER — Ambulatory Visit: Payer: PPO

## 2021-08-08 DIAGNOSIS — I77810 Thoracic aortic ectasia: Secondary | ICD-10-CM

## 2021-08-08 DIAGNOSIS — I2510 Atherosclerotic heart disease of native coronary artery without angina pectoris: Secondary | ICD-10-CM | POA: Diagnosis not present

## 2021-08-09 ENCOUNTER — Other Ambulatory Visit: Payer: Self-pay

## 2021-08-09 DIAGNOSIS — G4733 Obstructive sleep apnea (adult) (pediatric): Secondary | ICD-10-CM | POA: Diagnosis not present

## 2021-08-09 MED ORDER — REPATHA 140 MG/ML ~~LOC~~ SOSY: 1.0000 mL | PREFILLED_SYRINGE | SUBCUTANEOUS | 3 refills | Status: DC

## 2021-08-23 DIAGNOSIS — Z1389 Encounter for screening for other disorder: Secondary | ICD-10-CM | POA: Diagnosis not present

## 2021-08-23 DIAGNOSIS — Z0000 Encounter for general adult medical examination without abnormal findings: Secondary | ICD-10-CM | POA: Diagnosis not present

## 2021-08-24 DIAGNOSIS — H2513 Age-related nuclear cataract, bilateral: Secondary | ICD-10-CM | POA: Diagnosis not present

## 2021-08-24 DIAGNOSIS — H25013 Cortical age-related cataract, bilateral: Secondary | ICD-10-CM | POA: Diagnosis not present

## 2021-08-24 DIAGNOSIS — H524 Presbyopia: Secondary | ICD-10-CM | POA: Diagnosis not present

## 2021-08-24 DIAGNOSIS — H52201 Unspecified astigmatism, right eye: Secondary | ICD-10-CM | POA: Diagnosis not present

## 2021-08-25 DIAGNOSIS — I209 Angina pectoris, unspecified: Secondary | ICD-10-CM | POA: Diagnosis not present

## 2021-08-25 DIAGNOSIS — R7303 Prediabetes: Secondary | ICD-10-CM | POA: Diagnosis not present

## 2021-08-25 DIAGNOSIS — H6591 Unspecified nonsuppurative otitis media, right ear: Secondary | ICD-10-CM | POA: Diagnosis not present

## 2021-08-25 DIAGNOSIS — E7800 Pure hypercholesterolemia, unspecified: Secondary | ICD-10-CM | POA: Diagnosis not present

## 2021-08-25 DIAGNOSIS — G4733 Obstructive sleep apnea (adult) (pediatric): Secondary | ICD-10-CM | POA: Diagnosis not present

## 2021-08-25 DIAGNOSIS — T466X5A Adverse effect of antihyperlipidemic and antiarteriosclerotic drugs, initial encounter: Secondary | ICD-10-CM | POA: Diagnosis not present

## 2021-08-25 DIAGNOSIS — H6981 Other specified disorders of Eustachian tube, right ear: Secondary | ICD-10-CM | POA: Diagnosis not present

## 2021-09-06 DIAGNOSIS — G4733 Obstructive sleep apnea (adult) (pediatric): Secondary | ICD-10-CM | POA: Diagnosis not present

## 2021-09-09 DIAGNOSIS — G4733 Obstructive sleep apnea (adult) (pediatric): Secondary | ICD-10-CM | POA: Diagnosis not present

## 2021-09-15 DIAGNOSIS — H6981 Other specified disorders of Eustachian tube, right ear: Secondary | ICD-10-CM | POA: Diagnosis not present

## 2021-09-15 DIAGNOSIS — H903 Sensorineural hearing loss, bilateral: Secondary | ICD-10-CM | POA: Diagnosis not present

## 2021-10-09 DIAGNOSIS — G4733 Obstructive sleep apnea (adult) (pediatric): Secondary | ICD-10-CM | POA: Diagnosis not present

## 2021-10-12 DIAGNOSIS — R931 Abnormal findings on diagnostic imaging of heart and coronary circulation: Secondary | ICD-10-CM | POA: Diagnosis not present

## 2021-10-12 DIAGNOSIS — I2510 Atherosclerotic heart disease of native coronary artery without angina pectoris: Secondary | ICD-10-CM | POA: Diagnosis not present

## 2021-10-12 DIAGNOSIS — E7800 Pure hypercholesterolemia, unspecified: Secondary | ICD-10-CM | POA: Diagnosis not present

## 2021-10-13 LAB — LIPID PANEL WITH LDL/HDL RATIO
Cholesterol, Total: 147 mg/dL (ref 100–199)
HDL: 66 mg/dL (ref >39)
LDL Chol Calc (NIH): 62 mg/dL (ref 0–99)
LDL/HDL Ratio: 0.9 ratio (ref 0.0–3.6)
Triglycerides: 105 mg/dL (ref 0–149)
VLDL Cholesterol Cal: 19 mg/dL (ref 5–40)

## 2021-10-20 ENCOUNTER — Ambulatory Visit: Payer: Medicare Other | Admitting: Cardiology

## 2021-10-20 ENCOUNTER — Other Ambulatory Visit: Payer: Self-pay

## 2021-10-20 ENCOUNTER — Encounter: Payer: Self-pay | Admitting: Cardiology

## 2021-10-20 VITALS — BP 156/87 | HR 67 | Temp 97.4°F | Resp 16 | Ht 70.0 in | Wt 194.0 lb

## 2021-10-20 DIAGNOSIS — I351 Nonrheumatic aortic (valve) insufficiency: Secondary | ICD-10-CM

## 2021-10-20 DIAGNOSIS — R739 Hyperglycemia, unspecified: Secondary | ICD-10-CM

## 2021-10-20 DIAGNOSIS — E7800 Pure hypercholesterolemia, unspecified: Secondary | ICD-10-CM

## 2021-10-20 DIAGNOSIS — R030 Elevated blood-pressure reading, without diagnosis of hypertension: Secondary | ICD-10-CM

## 2021-10-20 DIAGNOSIS — I2510 Atherosclerotic heart disease of native coronary artery without angina pectoris: Secondary | ICD-10-CM

## 2021-10-20 MED ORDER — VALSARTAN 80 MG PO TABS: 80.0000 mg | ORAL_TABLET | Freq: Every day | ORAL | 3 refills | Status: DC

## 2021-10-20 NOTE — Progress Notes (Signed)
Primary Physician/Referring:  Ileana Ladd, MD  Patient ID: Scott Salas, male    DOB: [birth year], 71 y.o.   MRN: [id number]  Chief Complaint  Patient presents with   Coronary Artery Disease   Hyperlipidemia   aortic root dilatation   Follow-up    6 month   Results    echo   HPI:    Scott Salas  is a 71 y.o. Caucasian male with chronic stable angina pectoris, chronic dyspnea on exertion, on aggressive medical therapy symptoms have completely resolved.  His cardiovascular risks include family history of premature coronary disease with father had CABG at age 42, hypertension, hyperlipidemia and hyperglycemia.  Patient has statin induced myopathy, hence he is presently on Repatha.  He is tolerating this except the last dose he took in December had significant myalgias.  He is willing to try this again.  Otherwise he remains asymptomatic.  Past Medical History:  Diagnosis Date   Arthritis    Chest pain    Hyperlipidemia    Past Surgical History:  Procedure Laterality Date   acl left knee     left knee   KNEE ARTHROSCOPY     right   TONSILLECTOMY     as child   TOTAL KNEE ARTHROPLASTY Right 07/21/2013   Procedure: RIGHT TOTAL KNEE ARTHROPLASTY;  Surgeon: Loanne Drilling, MD;  Location: [facility];  Service: Orthopedics;  Laterality: Right;   Family History  Problem Relation Age of Onset   Heart failure Father    Heart disease Father    Diabetes Father    Cancer Maternal Grandfather    Hypertension Mother    Social History   Tobacco Use   Smoking status: Former    Packs/day: 1.00    Years: 10.00    Pack years: 10.00    Types: Cigarettes    Quit date: 1982    Years since quitting: 41.0   Smokeless tobacco: Never  Substance Use Topics   Alcohol use: Yes    Comment: occassionally  Marital Status: Married    ROS  Review of Systems  Cardiovascular:  Negative for chest pain, claudication, dyspnea on exertion, palpitations, paroxysmal nocturnal dyspnea and  syncope.  Respiratory:  Positive for snoring (on CPAP since 01/2021).   Objective  Blood pressure (!) 156/87, pulse 67, temperature (!) 97.4 F (36.3 C), resp. rate 16, height 5\' 10"  (1.778 m), weight 194 lb (88 kg), SpO2 99 %.  Vitals with BMI 10/20/2021 10/20/2021 07/20/2021  Height - 5\' 10"  5\' 10"   Weight - 194 lbs 183 lbs  BMI - 27.84 26.26  Systolic 156 153 09/19/2021  Diastolic 87 83 70  Pulse 67 63 63     Physical Exam Vitals reviewed.  Constitutional:      Appearance: He is well-developed.  Neck:     Thyroid: No thyromegaly.     Vascular: No JVD.  Cardiovascular:     Rate and Rhythm: Normal rate and regular rhythm.     Pulses: Normal pulses and intact distal pulses.     Heart sounds: No murmur heard. Early systolic murmur is present at the upper right sternal border.    No gallop.     Comments: No leg edema, no JVD. Pulmonary:     Effort: Pulmonary effort is normal.     Breath sounds: Normal breath sounds.  Abdominal:     General: Bowel sounds are normal.     Palpations: Abdomen is soft.   Laboratory examination:  No results for input(s): NA, K, CL, CO2, GLUCOSE, BUN, CREATININE, CALCIUM, GFRNONAA, GFRAA in the last 8760 hours. CrCl cannot be calculated (Patient's most recent lab result is older than the maximum 21 days allowed.).  CMP Latest Ref Rng & Units 09/15/2019 07/22/2013 07/16/2013  Glucose 70 - 99 mg/dL 130(H) 164(H) 94  BUN 8 - 23 mg/dL _0 Creatinine 0.61 - 1.24 mg/dL 0.81 0.61 0.98  Sodium 135 - 145 mmol/L 138 133(L) 139  Potassium 3.5 - 5.1 mmol/L 4.4 4.4 4.9  Chloride 98 - 111 mmol/L 99 99 101  CO2 22 - 32 mmol/L _1 Calcium 8.9 - 10.3 mg/dL 9.7 9.3 9.8  Total Protein 6.0 - 8.3 g/dL - - 7.4  Total Bilirubin 0.3 - 1.2 mg/dL - - 0.4  Alkaline Phos 39 - 117 U/L - - 67  AST 0 - 37 U/L - - 23  ALT 0 - 53 U/L - - 23   CBC Latest Ref Rng & Units 09/15/2019 07/22/2013 07/16/2013  WBC 4.0 - 10.5 K/uL 7.2 17.3(H) 6.8  Hemoglobin 13.0 - 17.0 g/dL 15.4  12.6(L) 14.2  Hematocrit 39.0 - 52.0 % 45.7 35.4(L) 41.8  Platelets 150 - 400 K/uL 252 301 286   Lipid Panel     Component Value Date/Time   CHOL 147 10/12/2021 0859   TRIG 105 10/12/2021 0859   HDL 66 10/12/2021 0859   LDLCALC 62 10/12/2021 0859   A1C 5.900 % 04/23/2020 TSH 2.040 04/23/2020  Hemoglobin 13.400 g/d 04/23/2020  Creatinine, Serum 0.830 mg/ 04/23/2020 Potassium 4.500 mm 04/23/2020 ALT (SGPT) 25.000 U/L 04/23/2020    Labs 07/16/2019: Serum glucose 100 mg, BUN 16, creatinine 0.89, EGFR >60 mL.  Potassium 4.8.  CMP normal. Total cholesterol 218, triglycerides 120, HDL 57, LDL 136, non-HDL cholesterol 160.  Labs 05/08/2018: A1c 6.1%.  Serum glucose 128 mg. BUN 14, creatinine 0.86, EGFR >60 mL.  Potassium 5.6.  CMP otherwise normal.  Total cholesterol scratch that Total cholesterol 208, triglycerides 103, HDL 59, LDL 129, non-HDL cholesterol 149. Allergies   Allergies  Allergen Reactions   Livalo [Pitavastatin] Other (See Comments)    Myalgia   Crestor [Rosuvastatin]     Other reaction(s): muscle aches   Atorvastatin Other (See Comments)    myalgia      Medications Prior to Visit:   Outpatient Medications Prior to Visit  Medication Sig Dispense Refill   acetaminophen (TYLENOL) 500 MG tablet Take 500 mg by mouth every 6 (six) hours as needed.     Evolocumab (REPATHA) 140 MG/ML SOSY Inject 1 mL into the skin every 14 (fourteen) days. 2.1 mL 3   fexofenadine (ALLEGRA) 180 MG tablet      fluticasone (FLONASE) 50 MCG/ACT nasal spray Place 1 spray into both nostrils as needed.     ibuprofen (ADVIL) 100 MG tablet Take 100 mg by mouth every 6 (six) hours as needed for fever.     sildenafil (REVATIO) 20 MG tablet Take 20 mg by mouth daily as needed.     No facility-administered medications prior to visit.   Final Medications at End of Visit    Current Meds  Medication Sig   acetaminophen (TYLENOL) 500 MG tablet Take 500 mg by mouth every 6 (six) hours as needed.    Evolocumab (REPATHA) 140 MG/ML SOSY Inject 1 mL into the skin every 14 (fourteen) days.   fexofenadine (ALLEGRA) 180 MG tablet    fluticasone (FLONASE) 50 MCG/ACT nasal spray Place 1 spray  into both nostrils as needed.   ibuprofen (ADVIL) 100 MG tablet Take 100 mg by mouth every 6 (six) hours as needed for fever.   sildenafil (REVATIO) 20 MG tablet Take 20 mg by mouth daily as needed.   valsartan (DIOVAN) 80 MG tablet Take 1 tablet (80 mg total) by mouth daily.    Radiology:   Chest x-ray 09/15/2019: Heart size within normal limits. There is no airspace consolidation within the lungs. No evidence of pleural effusion or pneumothorax. No acute bony abnormality. Multilevel thoracic spine anterolateral osteophytes  Cardiac Studies:   Lexiscan Tetrofosmin Stress Test  10/13/2019: Nondiagnostic ECG stress. Normal myocardial perfusion. All segments of left ventricle demonstrated normal wall motion and thickening. Stress LV EF is hyperdynamic 80%.  Stress LV EF: 80%.  Low risk study. No previous exam available for comparison.  Coronary calcium score 03/03/2020: LM: 86 LAD: 356 LCx: 101 RCA: 75. Total Agatston score 619, MESA database percentile 93. Ascending aorta mildly enlarged at 43 mm.  Visualized noncardiac structures within normal limits.  PCV ECHOCARDIOGRAM COMPLETE 08/08/2021  Narrative Echocardiogram 08/08/2021: Left ventricle cavity is normal in size. Moderate concentric hypertrophy of the left ventricle. Normal global wall motion. Normal LV systolic function with EF 59%. Normal diastolic filling pattern. Structurally normal trileaflet aortic valve. Mild (Grade I) aortic regurgitation. Mild (Grade I) mitral regurgitation. Normal right atrial pressure.   EKG:  EKG 10/20/2021: Normal sinus rhythm at rate of 64 bpm without evidence of ischemia.  Normal EKG.  No significant change from 11/17/2020  Assessment     ICD-10-CM   1. Coronary artery disease involving native  coronary artery of native heart without angina pectoris  I25.10 EKG 12-Lead    valsartan (DIOVAN) 80 MG tablet    Basic metabolic panel    2. Hypercholesteremia  E78.00     3. Hyperglycemia  R73.9     4. Mild aortic regurgitation  I35.1     5. Elevated BP without diagnosis of hypertension  R03.0 valsartan (DIOVAN) 80 MG tablet     Meds ordered this encounter  Medications   valsartan (DIOVAN) 80 MG tablet    Sig: Take 1 tablet (80 mg total) by mouth daily.    Dispense:  90 tablet    Refill:  3    There are no discontinued medications.     Orders Placed This Encounter  Procedures   Basic metabolic panel   EKG [id number]     Recommendations:   JERED HEINY  is a 71 y.o. Caucasian male with chronic stable angina pectoris, chronic dyspnea on exertion, on aggressive medical therapy symptoms have completely resolved.  He had responded very well to statin therapy but developed statin induced myopathy, in view of his above risk factors along with family history of premature coronary disease in his father who had CABG at age 43, hypertension, hyperglycemia, he was started on Repatha.  He has been on Repatha since October 2022.  He has tolerated the medication well until September 29, 2021 was the last time he took the shot, stated that he developed significant myalgias.  He is willing to try the Repatha again to see whether this effect would recur.  With regard to his aortic regurgitation, repeat echocardiogram essentially reveals mild to moderate LVH along with mild aortic regurgitation.  No further evaluation is indicated, no endocarditis prophylaxis indicated.  His blood pressure is elevated, I reviewed his blood pressure recordings from home, completely normal and perfect blood pressure.  Suspect he has  whitecoat hypertension.  However in view of hyperlipidemia and mild aortic regurgitation, LVH noted on the echocardiogram, could consider low-dose of ACE/ARB inhibitor therapy.   Discussions regarding hypoglycemia, underlying CAD, I will start him on valsartan 80 mg daily, will check BMP in 2 weeks.  Otherwise stable from cardiac standpoint, he will let us know how he tolerates Repatha going forward and I will see him back in a year or sooner if problems.     Adrian Prows, MD, [facility] 10/20/2021, 9:14 AM Office: [phone] Fax: [phone] Pager: [phone]

## 2021-11-09 DIAGNOSIS — I2510 Atherosclerotic heart disease of native coronary artery without angina pectoris: Secondary | ICD-10-CM | POA: Diagnosis not present

## 2021-11-10 LAB — BASIC METABOLIC PANEL
BUN/Creatinine Ratio: 16 (ref 10–24)
BUN: 17 mg/dL (ref 8–27)
CO2: 21 mmol/L (ref 20–29)
Calcium: 9.6 mg/dL (ref 8.6–10.2)
Chloride: 102 mmol/L (ref 96–106)
Creatinine, Ser: 1.05 mg/dL (ref 0.76–1.27)
Glucose: 68 mg/dL — ABNORMAL LOW (ref 70–99)
Potassium: 5.2 mmol/L (ref 3.5–5.2)
Sodium: 140 mmol/L (ref 134–144)
eGFR: 77 mL/min/{1.73_m2} (ref >59)

## 2021-11-29 ENCOUNTER — Encounter: Payer: Self-pay | Admitting: Cardiology

## 2021-12-14 ENCOUNTER — Encounter: Payer: Self-pay | Admitting: Cardiology

## 2021-12-14 DIAGNOSIS — H6981 Other specified disorders of Eustachian tube, right ear: Secondary | ICD-10-CM | POA: Diagnosis not present

## 2021-12-14 DIAGNOSIS — H60331 Swimmer's ear, right ear: Secondary | ICD-10-CM | POA: Diagnosis not present

## 2021-12-14 NOTE — Telephone Encounter (Signed)
From patient.

## 2021-12-19 DIAGNOSIS — H6981 Other specified disorders of Eustachian tube, right ear: Secondary | ICD-10-CM | POA: Diagnosis not present

## 2021-12-19 DIAGNOSIS — H903 Sensorineural hearing loss, bilateral: Secondary | ICD-10-CM | POA: Diagnosis not present

## 2021-12-19 DIAGNOSIS — H6121 Impacted cerumen, right ear: Secondary | ICD-10-CM | POA: Diagnosis not present

## 2021-12-26 DIAGNOSIS — H6981 Other specified disorders of Eustachian tube, right ear: Secondary | ICD-10-CM | POA: Diagnosis not present

## 2021-12-26 DIAGNOSIS — H6521 Chronic serous otitis media, right ear: Secondary | ICD-10-CM | POA: Diagnosis not present

## 2021-12-27 DIAGNOSIS — Z125 Encounter for screening for malignant neoplasm of prostate: Secondary | ICD-10-CM | POA: Diagnosis not present

## 2021-12-27 DIAGNOSIS — R7303 Prediabetes: Secondary | ICD-10-CM | POA: Diagnosis not present

## 2021-12-27 DIAGNOSIS — I209 Angina pectoris, unspecified: Secondary | ICD-10-CM | POA: Diagnosis not present

## 2021-12-27 DIAGNOSIS — E7800 Pure hypercholesterolemia, unspecified: Secondary | ICD-10-CM | POA: Diagnosis not present

## 2021-12-27 DIAGNOSIS — G4733 Obstructive sleep apnea (adult) (pediatric): Secondary | ICD-10-CM | POA: Diagnosis not present

## 2021-12-27 NOTE — Telephone Encounter (Signed)
From pt

## 2021-12-29 ENCOUNTER — Ambulatory Visit: Payer: Medicare Other | Admitting: Cardiology

## 2021-12-29 ENCOUNTER — Other Ambulatory Visit: Payer: Self-pay

## 2021-12-29 ENCOUNTER — Encounter: Payer: Self-pay | Admitting: Cardiology

## 2021-12-29 VITALS — BP 144/84 | HR 60 | Temp 97.7°F | Resp 16 | Ht 70.0 in | Wt 185.0 lb

## 2021-12-29 DIAGNOSIS — E7800 Pure hypercholesterolemia, unspecified: Secondary | ICD-10-CM | POA: Diagnosis not present

## 2021-12-29 DIAGNOSIS — I10 Essential (primary) hypertension: Secondary | ICD-10-CM | POA: Diagnosis not present

## 2021-12-29 DIAGNOSIS — I2510 Atherosclerotic heart disease of native coronary artery without angina pectoris: Secondary | ICD-10-CM | POA: Diagnosis not present

## 2021-12-29 MED ORDER — AMLODIPINE BESYLATE 5 MG PO TABS: 5.0000 mg | ORAL_TABLET | Freq: Every day | ORAL | 3 refills | Status: DC

## 2021-12-29 NOTE — Progress Notes (Signed)
? ?Primary Physician/Referring:  Vernie Shanks, MD ? ?Patient ID: Scott Salas, male    DOB: Sep 25, 1951, 71 y.o.   MRN: [id number] ? ?Chief Complaint  ?Patient presents with  ? Coronary Artery Disease  ? Medication Management  ? ?HPI:   ? ?Scott Salas  is a 71 y.o. Caucasian male with chronic stable angina pectoris, chronic dyspnea on exertion, on aggressive medical therapy symptoms have completely resolved.  His cardiovascular risks include family history of premature coronary disease with father had CABG at age 29, hypertension, hyperlipidemia and hyperglycemia.  Patient has history of statin induced myopathy. ? ?Patient was started on valsartan at last office visit and following initiation of this medication developed muscle pains and weakness.  Patient was unsure if this was related to Repatha or valsartan, he was therefore discontinued both.  Patient held both Repatha and valsartan for 1 week and muscle pains improved, will need to rechallenge the medications he developed muscle weakness and pain approximately 5 days later.  Patient has not taken either of these medications in at least 1 week. ? ?He is otherwise asymptomatic from a cardiovascular standpoint.  Notably patient tolerated Repatha without issue from October to December prior to initiation of valsartan.  Notably patient was started on Zetia by PCP about 2 days ago.  He is presently without symptoms of myalgias. ? ?Past Medical History:  ?Diagnosis Date  ? Arthritis   ? Chest pain   ? Hyperlipidemia   ? ?Past Surgical History:  ?Procedure Laterality Date  ? acl left knee    ? left knee  ? KNEE ARTHROSCOPY    ? right  ? TONSILLECTOMY    ? as child  ? TOTAL KNEE ARTHROPLASTY Right 07/21/2013  ? Procedure: RIGHT TOTAL KNEE ARTHROPLASTY;  Surgeon: Gearlean Alf, MD;  Location: [facility];  Service: Orthopedics;  Laterality: Right;  ? ?Family History  ?Problem Relation Age of Onset  ? Heart failure Father   ? Heart disease Father   ? Diabetes Father    ? Cancer Maternal Grandfather   ? Hypertension Mother   ? ?Social History  ? ?Tobacco Use  ? Smoking status: Former  ?  Packs/day: 1.00  ?  Years: 10.00  ?  Pack years: 10.00  ?  Types: Cigarettes  ?  Quit date: 35  ?  Years since quitting: 41.2  ? Smokeless tobacco: Never  ?Substance Use Topics  ? Alcohol use: Yes  ?  Comment: occassionally  ?Marital Status: Married  ?  ?ROS  ?Review of Systems  ?Cardiovascular:  Negative for chest pain, claudication, dyspnea on exertion, palpitations, paroxysmal nocturnal dyspnea and syncope.  ?Respiratory:  Positive for snoring (on CPAP since 01/2021).   ?Musculoskeletal:  Positive for myalgias (improved since holding valsartan and Reptha).  ? ?Objective  ?Blood pressure (!) 144/84, pulse 60, temperature 97.7 ?F (36.5 ?C), resp. rate 16, height '5\' 10"'  (1.778 m), weight 185 lb (83.9 kg), SpO2 98 %.  ?Vitals with BMI 12/29/2021 10/20/2021 10/20/2021  ?Height '5\' 10"'  - '5\' 10"'   ?Weight 185 lbs - 194 lbs  ?BMI 26.54 - 27.84  ?Systolic 614 431 540  ?Diastolic 84 87 83  ?Pulse 60 67 63  ?  ? Physical Exam ?Vitals reviewed.  ?Constitutional:   ?   Appearance: He is well-developed.  ?Neck:  ?   Thyroid: No thyromegaly.  ?   Vascular: No JVD.  ?Cardiovascular:  ?   Rate and Rhythm: Normal rate and regular rhythm.  ?  Pulses: Normal pulses and intact distal pulses.  ?   Heart sounds: No murmur heard. ?Early systolic murmur is present at the upper right sternal border.  ?  No gallop.  ?   Comments: No JVD. ?Pulmonary:  ?   Effort: Pulmonary effort is normal.  ?   Breath sounds: Normal breath sounds.  ?Musculoskeletal:  ?   Right lower leg: No edema.  ?   Left lower leg: No edema.  ? ?Laboratory examination:  ? ?Recent Labs  ?  11/09/21 ?1100  ?NA 140  ?K 5.2  ?CL 102  ?CO2 21  ?GLUCOSE 68*  ?BUN 17  ?CREATININE 1.05  ?CALCIUM 9.6  ? ?CrCl cannot be calculated (Patient's most recent lab result is older than the maximum 21 days allowed.).  ?CMP Latest Ref Rng & Units 11/09/2021 09/15/2019  07/22/2013  ?Glucose 70 - 99 mg/dL 68(L) 130(H) 164(H)  ?BUN 8 - 27 mg/dL '17 15 12  ' ?Creatinine 0.76 - 1.27 mg/dL 1.05 0.81 0.61  ?Sodium 134 - 144 mmol/L 140 138 133(L)  ?Potassium 3.5 - 5.2 mmol/L 5.2 4.4 4.4  ?Chloride 96 - 106 mmol/L 102 99 99  ?CO2 20 - 29 mmol/L '21 27 23  ' ?Calcium 8.6 - 10.2 mg/dL 9.6 9.7 9.3  ?Total Protein 6.0 - 8.3 g/dL - - -  ?Total Bilirubin 0.3 - 1.2 mg/dL - - -  ?Alkaline Phos 39 - 117 U/L - - -  ?AST 0 - 37 U/L - - -  ?ALT 0 - 53 U/L - - -  ? ?CBC Latest Ref Rng & Units 09/15/2019 07/22/2013 07/16/2013  ?WBC 4.0 - 10.5 K/uL 7.2 17.3(H) 6.8  ?Hemoglobin 13.0 - 17.0 g/dL 15.4 12.6(L) 14.2  ?Hematocrit 39.0 - 52.0 % 45.7 35.4(L) 41.8  ?Platelets 150 - 400 K/uL 252 301 286  ? ?Lipid Panel  ?   ?Component Value Date/Time  ? CHOL 147 10/12/2021 0859  ? TRIG 105 10/12/2021 0859  ? HDL 66 10/12/2021 0859  ? [place] 62 10/12/2021 0859  ? ?A1C 5.900 % 04/23/2020 ?TSH 2.040 04/23/2020 ? ?Hemoglobin 13.400 g/d 04/23/2020 ? ?Creatinine, Serum 0.830 mg/ 04/23/2020 ?Potassium 4.500 mm 04/23/2020 ?ALT (SGPT) 25.000 U/L 04/23/2020 ?  ? ?Labs 07/16/2019: Serum glucose 100 mg, BUN 16, creatinine 0.89, EGFR >60 mL.  Potassium 4.8.  CMP normal. ?Total cholesterol 218, triglycerides 120, HDL 57, LDL 136, non-HDL cholesterol 160. ? ?Labs 05/08/2018: A1c 6.1%.  Serum glucose 128 mg. ?BUN 14, creatinine 0.86, EGFR >60 mL.  Potassium 5.6.  CMP otherwise normal.  Total cholesterol scratch that ?Total cholesterol 208, triglycerides 103, HDL 59, LDL 129, non-HDL cholesterol 149. ?Allergies  ? ?Allergies  ?Allergen Reactions  ? Livalo [Pitavastatin] Other (See Comments)  ?  Myalgia  ? Crestor [Rosuvastatin]   ?  Other reaction(s): muscle aches  ? Atorvastatin Other (See Comments)  ?  myalgia  ?  ?Medications Prior to Visit:  ? ?Outpatient Medications Prior to Visit  ?Medication Sig Dispense Refill  ? acetaminophen (TYLENOL) 500 MG tablet Take 500 mg by mouth every 6 (six) hours as needed.    ? fexofenadine (ALLEGRA) 180 MG tablet      ? fluticasone (FLONASE) 50 MCG/ACT nasal spray Place 1 spray into both nostrils as needed.    ? ibuprofen (ADVIL) 100 MG tablet Take 100 mg by mouth every 6 (six) hours as needed for fever.    ? sildenafil (REVATIO) 20 MG tablet Take 20 mg by mouth daily as needed.    ?  Evolocumab (REPATHA) 140 MG/ML SOSY Inject 1 mL into the skin every 14 (fourteen) days. (Patient not taking: Reported on 12/29/2021) 2.1 mL 3  ? ezetimibe (ZETIA) 10 MG tablet Take 10 mg by mouth daily.    ? valsartan (DIOVAN) 80 MG tablet Take 1 tablet (80 mg total) by mouth daily. (Patient not taking: Reported on 12/29/2021) 90 tablet 3  ? ?No facility-administered medications prior to visit.  ? ?Final Medications at End of Visit   ? ?Current Meds  ?Medication Sig  ? acetaminophen (TYLENOL) 500 MG tablet Take 500 mg by mouth every 6 (six) hours as needed.  ? amLODipine (NORVASC) 5 MG tablet Take 1 tablet (5 mg total) by mouth daily.  ? fexofenadine (ALLEGRA) 180 MG tablet   ? fluticasone (FLONASE) 50 MCG/ACT nasal spray Place 1 spray into both nostrils as needed.  ? ibuprofen (ADVIL) 100 MG tablet Take 100 mg by mouth every 6 (six) hours as needed for fever.  ? sildenafil (REVATIO) 20 MG tablet Take 20 mg by mouth daily as needed.  ?  ?Radiology:  ? ?Chest x-ray 09/15/2019: ?Heart size within normal limits. ?There is no airspace consolidation within the lungs. ?No evidence of pleural effusion or pneumothorax. ?No acute bony abnormality. Multilevel thoracic spine anterolateral ?osteophytes ? ?Cardiac Studies:  ? ?Lexiscan Tetrofosmin Stress Test  10/13/2019: ?Nondiagnostic ECG stress. ?Normal myocardial perfusion. All segments of left ventricle demonstrated normal wall motion and thickening. Stress LV EF is hyperdynamic 80%.  Stress LV EF: 80%.  ?Low risk study. No previous exam available for comparison. ? ?Coronary calcium score 03/03/2020: ?LM: 86 ?LAD: 356 ?LCx: 101 ?RCA: 75. ?Total Agatston score 619, MESA database percentile 93. ?Ascending  aorta mildly enlarged at 43 mm.  Visualized noncardiac structures within normal limits. ? ?PCV ECHOCARDIOGRAM COMPLETE 08/08/2021 ? ?Narrative ?Echocardiogram 08/08/2021: ?Left ventricle cavity is normal in size

## 2022-01-03 DIAGNOSIS — G4733 Obstructive sleep apnea (adult) (pediatric): Secondary | ICD-10-CM | POA: Diagnosis not present

## 2022-01-04 DIAGNOSIS — E7800 Pure hypercholesterolemia, unspecified: Secondary | ICD-10-CM | POA: Diagnosis not present

## 2022-01-04 DIAGNOSIS — E663 Overweight: Secondary | ICD-10-CM | POA: Diagnosis not present

## 2022-01-04 DIAGNOSIS — R7303 Prediabetes: Secondary | ICD-10-CM | POA: Diagnosis not present

## 2022-01-09 ENCOUNTER — Encounter: Payer: Self-pay | Admitting: Cardiology

## 2022-01-09 NOTE — Telephone Encounter (Signed)
From patient.

## 2022-01-30 ENCOUNTER — Ambulatory Visit: Payer: Medicare Other | Admitting: Student

## 2022-01-30 DIAGNOSIS — H903 Sensorineural hearing loss, bilateral: Secondary | ICD-10-CM | POA: Diagnosis not present

## 2022-01-30 DIAGNOSIS — H6981 Other specified disorders of Eustachian tube, right ear: Secondary | ICD-10-CM | POA: Diagnosis not present

## 2022-01-30 DIAGNOSIS — H7201 Central perforation of tympanic membrane, right ear: Secondary | ICD-10-CM | POA: Diagnosis not present

## 2022-01-31 DIAGNOSIS — G4733 Obstructive sleep apnea (adult) (pediatric): Secondary | ICD-10-CM | POA: Diagnosis not present

## 2022-02-03 DIAGNOSIS — G4733 Obstructive sleep apnea (adult) (pediatric): Secondary | ICD-10-CM | POA: Diagnosis not present

## 2022-02-13 ENCOUNTER — Ambulatory Visit: Payer: Medicare Other | Admitting: Student

## 2022-02-13 ENCOUNTER — Encounter: Payer: Self-pay | Admitting: Student

## 2022-02-13 VITALS — BP 155/86 | HR 55 | Temp 98.4°F | Resp 17 | Ht 70.0 in | Wt 194.0 lb

## 2022-02-13 DIAGNOSIS — E7800 Pure hypercholesterolemia, unspecified: Secondary | ICD-10-CM

## 2022-02-13 DIAGNOSIS — I10 Essential (primary) hypertension: Secondary | ICD-10-CM

## 2022-02-13 DIAGNOSIS — I2510 Atherosclerotic heart disease of native coronary artery without angina pectoris: Secondary | ICD-10-CM

## 2022-02-13 MED ORDER — ASPIRIN EC 81 MG PO TBEC: 81.0000 mg | DELAYED_RELEASE_TABLET | Freq: Every day | ORAL | 3 refills | Status: DC

## 2022-02-13 MED ORDER — AMLODIPINE BESYLATE 5 MG PO TABS: 5.0000 mg | ORAL_TABLET | Freq: Every day | ORAL | 3 refills | Status: DC

## 2022-02-13 NOTE — Progress Notes (Signed)
? ?Primary Physician/Referring:  Vernie Shanks, MD ? ?Patient ID: Scott Salas, male    DOB: [birth year], 71 y.o.   MRN: [id number] ? ?Chief Complaint  ?Patient presents with  ? Hypertension  ? hld  ?  4 weeks  ? ?HPI:   ? ?Scott Salas  is a 71 y.o. Caucasian male with chronic stable angina pectoris, chronic dyspnea on exertion, on aggressive medical therapy symptoms have completely resolved.  His cardiovascular risks include family history of premature coronary disease with father had CABG at age 59, hypertension, hyperlipidemia and hyperglycemia.  Patient has history of statin induced myopathy. ? ?Last office visit patient expressed concern that valsartan and Repatha were causing myalgias, therefore he discontinued these medications.  At last visit shared decision was to switch valsartan to amlodipine 5 mg daily given uncontrolled hypertension, continue Zetia, rechallenge Repatha.  Patient now presents for 4 week follow-up of hypertension and hyperlipidemia.  Upon further questioning patient states that he discontinued amlodipine on 01/25/2022 due to concerns of muscle weakness.  He does bring with him a blood pressure log from that time it, blood pressure was well controlled on home monitoring.  Patient did not resume Zetia at all, however he did have a Repatha injection done on 01/27/2022 and approximately 1 week later reports muscle pain and weakness with a subsequent fall. ? ?Patient presently is not taking amlodipine, Zetia, or Repatha.  He has unfortunately not been able to tolerate statins, Zetia, or Repatha. ? ?Past Medical History:  ?Diagnosis Date  ? Arthritis   ? Chest pain   ? Hyperlipidemia   ? ?Past Surgical History:  ?Procedure Laterality Date  ? acl left knee    ? left knee  ? KNEE ARTHROSCOPY    ? right  ? TONSILLECTOMY    ? as child  ? TOTAL KNEE ARTHROPLASTY Right 07/21/2013  ? Procedure: RIGHT TOTAL KNEE ARTHROPLASTY;  Surgeon: Gearlean Alf, MD;  Location: [facility];  Service: Orthopedics;   Laterality: Right;  ? ?Family History  ?Problem Relation Age of Onset  ? Heart failure Father   ? Heart disease Father   ? Diabetes Father   ? Cancer Maternal Grandfather   ? Hypertension Mother   ? ?Social History  ? ?Tobacco Use  ? Smoking status: Former  ?  Packs/day: 1.00  ?  Years: 10.00  ?  Pack years: 10.00  ?  Types: Cigarettes  ?  Quit date: 10  ?  Years since quitting: 41.3  ? Smokeless tobacco: Never  ?Substance Use Topics  ? Alcohol use: Yes  ?  Comment: occassionally  ?Marital Status: Married  ?  ?ROS  ?Review of Systems  ?Cardiovascular:  Negative for chest pain, claudication, dyspnea on exertion, palpitations, paroxysmal nocturnal dyspnea and syncope.  ?Respiratory:  Positive for snoring (on CPAP since 01/2021).   ?Musculoskeletal:  Positive for myalgias.  ? ?Objective  ?Blood pressure (!) 155/86, pulse (!) 55, temperature 98.4 ?F (36.9 ?C), temperature source Temporal, resp. rate 17, height '5\' 10"'  (1.778 m), weight 194 lb (88 kg), SpO2 100 %.  ? ?  02/13/2022  ? 10:00 AM 02/13/2022  ?  9:52 AM 12/29/2021  ?  8:36 AM  ?Vitals with BMI  ?Height  '5\' 10"'  '5\' 10"'   ?Weight  194 lbs 185 lbs  ?BMI  27.84 26.54  ?Systolic 716 967 893  ?Diastolic 86 84 84  ?Pulse 55 54 60  ?  ? Physical Exam ?Vitals reviewed.  ?Constitutional:   ?  Appearance: He is well-developed.  ?Neck:  ?   Thyroid: No thyromegaly.  ?   Vascular: No JVD.  ?Cardiovascular:  ?   Rate and Rhythm: Normal rate and regular rhythm.  ?   Pulses: Normal pulses and intact distal pulses.  ?   Heart sounds: No murmur heard. ?Early systolic murmur is present at the upper right sternal border.  ?  No gallop.  ?   Comments: No JVD. ?Pulmonary:  ?   Effort: Pulmonary effort is normal.  ?   Breath sounds: Normal breath sounds.  ?Musculoskeletal:  ?   Right lower leg: No edema.  ?   Left lower leg: No edema.  ? ?Laboratory examination:  ? ?Recent Labs  ?  11/09/21 ?1100  ?NA 140  ?K 5.2  ?CL 102  ?CO2 21  ?GLUCOSE 68*  ?BUN 17  ?CREATININE 1.05  ?CALCIUM 9.6   ? ?CrCl cannot be calculated (Patient's most recent lab result is older than the maximum 21 days allowed.).  ? ?  Latest Ref Rng & Units 11/09/2021  ? 11:00 AM 09/15/2019  ?  3:26 PM 07/22/2013  ?  4:30 AM  ?CMP  ?Glucose 70 - 99 mg/dL 68   130   164    ?BUN 8 - 27 mg/dL '17   15   12    ' ?Creatinine 0.76 - 1.27 mg/dL 1.05   0.81   0.61    ?Sodium 134 - 144 mmol/L 140   138   133    ?Potassium 3.5 - 5.2 mmol/L 5.2   4.4   4.4    ?Chloride 96 - 106 mmol/L 102   99   99    ?CO2 20 - 29 mmol/L '21   27   23    ' ?Calcium 8.6 - 10.2 mg/dL 9.6   9.7   9.3    ? ? ?  Latest Ref Rng & Units 09/15/2019  ?  3:26 PM 07/22/2013  ?  4:30 AM 07/16/2013  ?  8:55 AM  ?CBC  ?WBC 4.0 - 10.5 K/uL 7.2   17.3   6.8    ?Hemoglobin 13.0 - 17.0 g/dL 15.4   12.6   14.2    ?Hematocrit 39.0 - 52.0 % 45.7   35.4   41.8    ?Platelets 150 - 400 K/uL 252   301   286    ? ?Lipid Panel  ?   ?Component Value Date/Time  ? CHOL 147 10/12/2021 0859  ? TRIG 105 10/12/2021 0859  ? HDL 66 10/12/2021 0859  ? [place] 62 10/12/2021 0859  ? ?External Labs:  ?12/27/2021: ?A1c 6.2% ?BUN 15, creatinine 0.84, GFR >60, potassium 5.2, sodium 138 ?Total cholesterol 225, LDL 131, HDL 61, triglycerides 196 ?Lipoprotein a 29.8  ? ?A1C 5.900 % 04/23/2020 ?TSH 2.040 04/23/2020 ? ?Hemoglobin 13.400 g/d 04/23/2020 ? ?Creatinine, Serum 0.830 mg/ 04/23/2020 ?Potassium 4.500 mm 04/23/2020 ?ALT (SGPT) 25.000 U/L 04/23/2020 ?  ? ?Labs 07/16/2019: Serum glucose 100 mg, BUN 16, creatinine 0.89, EGFR >60 mL.  Potassium 4.8.  CMP normal. ?Total cholesterol 218, triglycerides 120, HDL 57, LDL 136, non-HDL cholesterol 160. ? ?Labs 05/08/2018: A1c 6.1%.  Serum glucose 128 mg. ?BUN 14, creatinine 0.86, EGFR >60 mL.  Potassium 5.6.  CMP otherwise normal.  Total cholesterol scratch that ?Total cholesterol 208, triglycerides 103, HDL 59, LDL 129, non-HDL cholesterol 149. ?Allergies  ? ?Allergies  ?Allergen Reactions  ? Livalo [Pitavastatin] Other (See Comments)  ?  Myalgia  ?  Repatha [Evolocumab]   ?  Myalgia    ? Zetia [Ezetimibe]   ?  Myalgia   ? Crestor [Rosuvastatin]   ?  Other reaction(s): muscle aches  ? Atorvastatin Other (See Comments)  ?  myalgia  ?  ?Medications Prior to Visit:  ? ?Outpatient Medications Prior to Visit  ?Medication Sig Dispense Refill  ? acetaminophen (TYLENOL) 500 MG tablet Take 500 mg by mouth every 6 (six) hours as needed.    ? fexofenadine (ALLEGRA) 180 MG tablet     ? fluticasone (FLONASE) 50 MCG/ACT nasal spray Place 1 spray into both nostrils as needed.    ? ibuprofen (ADVIL) 100 MG tablet Take 100 mg by mouth every 6 (six) hours as needed for fever.    ? sildenafil (REVATIO) 20 MG tablet Take 20 mg by mouth daily as needed.    ? amLODipine (NORVASC) 5 MG tablet Take 1 tablet (5 mg total) by mouth daily. (Patient not taking: Reported on 02/13/2022) 30 tablet 3  ? Evolocumab (REPATHA) 140 MG/ML SOSY Inject 1 mL into the skin every 14 (fourteen) days. (Patient not taking: Reported on 02/13/2022) 2.1 mL 3  ? ezetimibe (ZETIA) 10 MG tablet Take 10 mg by mouth daily. (Patient not taking: Reported on 02/13/2022)    ? valsartan (DIOVAN) 80 MG tablet Take 80 mg by mouth daily. (Patient not taking: Reported on 02/13/2022)    ? ?No facility-administered medications prior to visit.  ? ?Final Medications at End of Visit   ? ?Current Meds  ?Medication Sig  ? acetaminophen (TYLENOL) 500 MG tablet Take 500 mg by mouth every 6 (six) hours as needed.  ? aspirin EC 81 MG tablet Take 1 tablet (81 mg total) by mouth daily. Swallow whole.  ? fexofenadine (ALLEGRA) 180 MG tablet   ? fluticasone (FLONASE) 50 MCG/ACT nasal spray Place 1 spray into both nostrils as needed.  ? ibuprofen (ADVIL) 100 MG tablet Take 100 mg by mouth every 6 (six) hours as needed for fever.  ? sildenafil (REVATIO) 20 MG tablet Take 20 mg by mouth daily as needed.  ?  ?Radiology:  ? ?Chest x-ray 09/15/2019: ?Heart size within normal limits. ?There is no airspace consolidation within the lungs. ?No evidence of pleural effusion or  pneumothorax. ?No acute bony abnormality. Multilevel thoracic spine anterolateral ?osteophytes ? ?Cardiac Studies:  ? ?Lexiscan Tetrofosmin Stress Test  10/13/2019: ?Nondiagnostic ECG stress. ?Normal myocardial perfu

## 2022-03-05 DIAGNOSIS — G4733 Obstructive sleep apnea (adult) (pediatric): Secondary | ICD-10-CM | POA: Diagnosis not present

## 2022-03-29 DIAGNOSIS — G4733 Obstructive sleep apnea (adult) (pediatric): Secondary | ICD-10-CM | POA: Diagnosis not present

## 2022-04-05 DIAGNOSIS — E7800 Pure hypercholesterolemia, unspecified: Secondary | ICD-10-CM | POA: Diagnosis not present

## 2022-04-05 DIAGNOSIS — R7303 Prediabetes: Secondary | ICD-10-CM | POA: Diagnosis not present

## 2022-04-12 DIAGNOSIS — G4733 Obstructive sleep apnea (adult) (pediatric): Secondary | ICD-10-CM | POA: Diagnosis not present

## 2022-05-02 ENCOUNTER — Ambulatory Visit: Payer: Medicare Other | Admitting: Student

## 2022-05-08 ENCOUNTER — Ambulatory Visit: Payer: Medicare Other | Admitting: Student

## 2022-05-08 ENCOUNTER — Encounter: Payer: Self-pay | Admitting: Student

## 2022-05-08 VITALS — BP 137/82 | HR 59 | Temp 98.2°F | Resp 16 | Ht 70.0 in | Wt 193.0 lb

## 2022-05-08 DIAGNOSIS — I10 Essential (primary) hypertension: Secondary | ICD-10-CM | POA: Diagnosis not present

## 2022-05-08 DIAGNOSIS — E663 Overweight: Secondary | ICD-10-CM | POA: Diagnosis not present

## 2022-05-08 DIAGNOSIS — E7800 Pure hypercholesterolemia, unspecified: Secondary | ICD-10-CM | POA: Diagnosis not present

## 2022-05-08 DIAGNOSIS — I2510 Atherosclerotic heart disease of native coronary artery without angina pectoris: Secondary | ICD-10-CM | POA: Diagnosis not present

## 2022-05-08 DIAGNOSIS — G4733 Obstructive sleep apnea (adult) (pediatric): Secondary | ICD-10-CM | POA: Diagnosis not present

## 2022-05-08 DIAGNOSIS — R7303 Prediabetes: Secondary | ICD-10-CM | POA: Diagnosis not present

## 2022-05-08 MED ORDER — AMLODIPINE BESYLATE 2.5 MG PO TABS: 2.5000 mg | ORAL_TABLET | Freq: Every day | ORAL | 3 refills | Status: DC

## 2022-05-08 MED ORDER — LOSARTAN POTASSIUM 25 MG PO TABS: 25.0000 mg | ORAL_TABLET | Freq: Every day | ORAL | 3 refills | Status: DC

## 2022-05-08 MED ORDER — REPATHA SURECLICK 140 MG/ML ~~LOC~~ SOAJ: 1.0000 mL | SUBCUTANEOUS | 6 refills | Status: DC

## 2022-05-08 MED ORDER — AMLODIPINE BESYLATE 2.5 MG PO TABS: 2.5000 mg | ORAL_TABLET | Freq: Every evening | ORAL | 3 refills | Status: DC

## 2022-05-08 NOTE — Progress Notes (Signed)
Primary Physician/Referring:  Kathalene Frames, MD  Patient ID: Scott Salas, male    DOB: 1950-11-02, 71 y.o.   MRN: [id number]  Chief Complaint  Patient presents with   Hypertension   Hyperlipidemia   Coronary Artery Disease   Follow-up    3 month   HPI:    Scott Salas  is a 71 y.o. Caucasian male with chronic stable angina pectoris, chronic dyspnea on exertion, on aggressive medical therapy symptoms have completely resolved.  His cardiovascular risks include family history of premature coronary disease with father had CABG at age 39, hypertension, hyperlipidemia and hyperglycemia.  Patient has history of statin induced myopathy.  Patient was last seen in the office 02/13/2022 at which time resumed amlodipine given uncontrolled hypertension and had started working to get patient on Leqvio given his inability to tolerate statin therapy, Zetia, or Repatha.  Patient now presents for 61-monthfollow-up.  On further questioning patient restarted Repatha instead of trying Leqvio.  He is now taking Repatha 1 mL once a month, I personally reviewed external labs, lipids are under excellent control now.  He is tolerating addition of amlodipine without issue.  Patient remains active playing tennis and biking.  Reports home blood pressure readings averaging 125-135/80s mmHg.  Patient does report ankle edema since starting amlodipine.  Past Medical History:  Diagnosis Date   Arthritis    Chest pain    Hyperlipidemia    Past Surgical History:  Procedure Laterality Date   acl left knee     left knee   KNEE ARTHROSCOPY     right   TONSILLECTOMY     as child   TOTAL KNEE ARTHROPLASTY Right 07/21/2013   Procedure: RIGHT TOTAL KNEE ARTHROPLASTY;  Surgeon: FGearlean Alf MD;  Location: [facility];  Service: Orthopedics;  Laterality: Right;   Family History  Problem Relation Age of Onset   Heart failure Father    Heart disease Father    Diabetes Father    Cancer Maternal Grandfather     Hypertension Mother    Social History   Tobacco Use   Smoking status: Former    Packs/day: 1.00    Years: 10.00    Total pack years: 10.00    Types: Cigarettes    Quit date: 1982    Years since quitting: 41.5   Smokeless tobacco: Never  Substance Use Topics   Alcohol use: Yes    Comment: occassionally  Marital Status: Married    ROS  Review of Systems  Cardiovascular:  Negative for chest pain, claudication, dyspnea on exertion, palpitations, paroxysmal nocturnal dyspnea and syncope.  Respiratory:  Positive for snoring (on CPAP since 01/2021).    Objective  Blood pressure 137/82, pulse (!) 59, temperature 98.2 F (36.8 C), resp. rate 16, height '5\' 10"'  (1.778 m), weight 193 lb (87.5 kg), SpO2 95 %.     05/08/2022   12:58 PM 05/08/2022   12:50 PM 02/13/2022   10:00 AM  Vitals with BMI  Height  '5\' 10"'    Weight  193 lbs   BMI  226.37  Systolic [id number] Diastolic 82 76 86  Pulse 59 57 55     Physical Exam Vitals reviewed.  Constitutional:      Appearance: He is well-developed.  Neck:     Thyroid: No thyromegaly.     Vascular: No JVD.  Cardiovascular:     Rate and Rhythm: Normal rate and regular rhythm.     Pulses:  Normal pulses and intact distal pulses.     Heart sounds: No murmur heard.    Early systolic murmur is present at the upper right sternal border.     No gallop.     Comments: No JVD. Pulmonary:     Effort: Pulmonary effort is normal.     Breath sounds: Normal breath sounds.  Musculoskeletal:     Right lower leg: No edema.     Left lower leg: No edema.   Physical exam unchanged compared to previous office visit.  Laboratory examination:   Recent Labs    11/09/21 1100  NA 140  K 5.2  CL 102  CO2 21  GLUCOSE 68*  BUN 17  CREATININE 1.05  CALCIUM 9.6   CrCl cannot be calculated (Patient's most recent lab result is older than the maximum 21 days allowed.).     Latest Ref Rng & Units 11/09/2021   11:00 AM 09/15/2019    3:26 PM 07/22/2013     4:30 AM  CMP  Glucose 70 - 99 mg/dL 68  130  164   BUN 8 - 27 mg/dL '17  15  12   ' Creatinine 0.76 - 1.27 mg/dL 1.05  0.81  0.61   Sodium 134 - 144 mmol/L 140  138  133   Potassium 3.5 - 5.2 mmol/L 5.2  4.4  4.4   Chloride 96 - 106 mmol/L 102  99  99   CO2 20 - 29 mmol/L '21  27  23   ' Calcium 8.6 - 10.2 mg/dL 9.6  9.7  9.3       Latest Ref Rng & Units 09/15/2019    3:26 PM 07/22/2013    4:30 AM 07/16/2013    8:55 AM  CBC  WBC 4.0 - 10.5 K/uL 7.2  17.3  6.8   Hemoglobin 13.0 - 17.0 g/dL 15.4  12.6  14.2   Hematocrit 39.0 - 52.0 % 45.7  35.4  41.8   Platelets 150 - 400 K/uL 252  301  286    Lipid Panel     Component Value Date/Time   CHOL 147 10/12/2021 0859   TRIG 105 10/12/2021 0859   HDL 66 10/12/2021 0859   LDLCALC 62 10/12/2021 0859   External Labs:  External labs 03/16/2022: ALT 18, AST 20, creatinine 0.8, GFR 94, potassium 5.4, sodium 142 LDL 54, total cholesterol 132, HDL 60, triglycerides 93 A1c 6.0%  12/27/2021: A1c 6.2% BUN 15, creatinine 0.84, GFR >60, potassium 5.2, sodium 138 Total cholesterol 225, LDL 131, HDL 61, triglycerides 196 Lipoprotein a 29.8   A1C 5.900 % 04/23/2020 TSH 2.040 04/23/2020  Hemoglobin 13.400 g/d 04/23/2020  Creatinine, Serum 0.830 mg/ 04/23/2020 Potassium 4.500 mm 04/23/2020 ALT (SGPT) 25.000 U/L 04/23/2020    Labs 07/16/2019: Serum glucose 100 mg, BUN 16, creatinine 0.89, EGFR >60 mL.  Potassium 4.8.  CMP normal. Total cholesterol 218, triglycerides 120, HDL 57, LDL 136, non-HDL cholesterol 160.  Labs 05/08/2018: A1c 6.1%.  Serum glucose 128 mg. BUN 14, creatinine 0.86, EGFR >60 mL.  Potassium 5.6.  CMP otherwise normal.  Total cholesterol scratch that Total cholesterol 208, triglycerides 103, HDL 59, LDL 129, non-HDL cholesterol 149. Allergies   Allergies  Allergen Reactions   Livalo [Pitavastatin] Other (See Comments)    Myalgia   Repatha [Evolocumab]     Myalgia    Zetia [Ezetimibe]     Myalgia    Crestor [Rosuvastatin]     Other  reaction(s): muscle aches   Atorvastatin Other (  See Comments)    myalgia    Medications Prior to Visit:   Outpatient Medications Prior to Visit  Medication Sig Dispense Refill   acetaminophen (TYLENOL) 500 MG tablet Take 500 mg by mouth every 6 (six) hours as needed.     aspirin EC 81 MG tablet Take 1 tablet (81 mg total) by mouth daily. Swallow whole. 90 tablet 3   fexofenadine (ALLEGRA) 180 MG tablet      fluticasone (FLONASE) 50 MCG/ACT nasal spray Place 1 spray into both nostrils as needed.     ibuprofen (ADVIL) 100 MG tablet Take 100 mg by mouth every 6 (six) hours as needed for fever.     sildenafil (REVATIO) 20 MG tablet Take 20 mg by mouth daily as needed.     amLODipine (NORVASC) 5 MG tablet Take 1 tablet (5 mg total) by mouth daily. 90 tablet 3   Evolocumab (REPATHA) 140 MG/ML SOSY 1 mL     No facility-administered medications prior to visit.   Final Medications at End of Visit    Current Meds  Medication Sig   acetaminophen (TYLENOL) 500 MG tablet Take 500 mg by mouth every 6 (six) hours as needed.   aspirin EC 81 MG tablet Take 1 tablet (81 mg total) by mouth daily. Swallow whole.   Evolocumab (REPATHA SURECLICK) 741 MG/ML SOAJ Inject 1 mL into the skin every 30 (thirty) days.   fexofenadine (ALLEGRA) 180 MG tablet    fluticasone (FLONASE) 50 MCG/ACT nasal spray Place 1 spray into both nostrils as needed.   ibuprofen (ADVIL) 100 MG tablet Take 100 mg by mouth every 6 (six) hours as needed for fever.   losartan (COZAAR) 25 MG tablet Take 1 tablet (25 mg total) by mouth daily.   sildenafil (REVATIO) 20 MG tablet Take 20 mg by mouth daily as needed.   [DISCONTINUED] amLODipine (NORVASC) 5 MG tablet Take 1 tablet (5 mg total) by mouth daily.   [DISCONTINUED] Evolocumab (REPATHA) 140 MG/ML SOSY 1 mL    Radiology:   Chest x-ray 09/15/2019: Heart size within normal limits. There is no airspace consolidation within the lungs. No evidence of pleural effusion or  pneumothorax. No acute bony abnormality. Multilevel thoracic spine anterolateral osteophytes  Cardiac Studies:   Lexiscan Tetrofosmin Stress Test  10/13/2019: Nondiagnostic ECG stress. Normal myocardial perfusion. All segments of left ventricle demonstrated normal wall motion and thickening. Stress LV EF is hyperdynamic 80%.  Stress LV EF: 80%.  Low risk study. No previous exam available for comparison.  Coronary calcium score 03/03/2020: LM: 86 LAD: 356 LCx: 101 RCA: 75. Total Agatston score 619, MESA database percentile 93. Ascending aorta mildly enlarged at 43 mm.  Visualized noncardiac structures within normal limits.  PCV ECHOCARDIOGRAM COMPLETE 08/08/2021 Left ventricle cavity is normal in size. Moderate concentric hypertrophy of the left ventricle. Normal global wall motion. Normal LV systolic function with EF 59%. Normal diastolic filling pattern. Structurally normal trileaflet aortic valve. Mild (Grade I) aortic regurgitation. Mild (Grade I) mitral regurgitation. Normal right atrial pressure.   EKG  05/08/2022: Sinus rhythm at a rate of 50 bpm.  Normal axis.  Nonspecific T wave abnormality.  Poor R wave progression, cannot exclude anteroseptal infarct old.  No evidence of ischemia or underlying injury pattern.  10/20/2021: Normal sinus rhythm at rate of 64 bpm without evidence of ischemia.  Normal EKG.  No significant change from 11/17/2020  Assessment     ICD-10-CM   1. Essential hypertension  I10 EKG 12-Lead  Basic metabolic panel    2. Hypercholesteremia  E78.00     3. Coronary artery disease involving native coronary artery of native heart without angina pectoris  S97.02 Basic metabolic panel     Meds ordered this encounter  Medications   Evolocumab (REPATHA SURECLICK) 637 MG/ML SOAJ    Sig: Inject 1 mL into the skin every 30 (thirty) days.    Dispense:  2 mL    Refill:  6   DISCONTD: amLODipine (NORVASC) 2.5 MG tablet    Sig: Take 1 tablet (2.5 mg total) by  mouth daily.    Dispense:  90 tablet    Refill:  3   losartan (COZAAR) 25 MG tablet    Sig: Take 1 tablet (25 mg total) by mouth daily.    Dispense:  90 tablet    Refill:  3   amLODipine (NORVASC) 2.5 MG tablet    Sig: Take 1 tablet (2.5 mg total) by mouth every evening.    Dispense:  90 tablet    Refill:  3   Medications Discontinued During This Encounter  Medication Reason   Evolocumab (REPATHA) 140 MG/ML SOSY Dose change   amLODipine (NORVASC) 5 MG tablet    amLODipine (NORVASC) 2.5 MG tablet      Orders Placed This Encounter  Procedures   Basic metabolic panel   EKG 85-YIFO    Recommendations:   CHUKWUMA STRAUS  is a 71 y.o. Caucasian male with chronic stable angina pectoris, chronic dyspnea on exertion, on aggressive medical therapy symptoms have completely resolved.  He had responded very well to statin therapy but developed statin induced myopathy, in view of his above risk factors along with family history of premature coronary disease in his father who had CABG at age 56, hypertension, hyperglycemia.   Patient was last seen in the office 02/13/2022 at which time resumed amlodipine given uncontrolled hypertension and had started working to get patient on Leqvio given his inability to tolerate statin therapy, Zetia, or Repatha.  Patient now presents for 64-monthfollow-up.  EKG and physical exam remain unchanged.  Given concern of ankle swelling with amlodipine will reduce amlodipine from 5 mg to 2.5 mg p.o. daily and add losartan for blood pressure control.  We will repeat BMP in 1 week.  I personally reviewed external labs, lipids are now under excellent control, continue Repatha once monthly.  Patient is otherwise stable from a cardiovascular standpoint.  Follow-up in 6 months, sooner if needed.   CAlethia Berthold PA-C 05/08/2022, 3:41 PM Office: [phone] CC: JOkey Dupre MD

## 2022-05-12 DIAGNOSIS — G4733 Obstructive sleep apnea (adult) (pediatric): Secondary | ICD-10-CM | POA: Diagnosis not present

## 2022-05-16 ENCOUNTER — Ambulatory Visit: Payer: Medicare Other | Admitting: Student

## 2022-05-16 DIAGNOSIS — I2510 Atherosclerotic heart disease of native coronary artery without angina pectoris: Secondary | ICD-10-CM | POA: Diagnosis not present

## 2022-05-16 DIAGNOSIS — I10 Essential (primary) hypertension: Secondary | ICD-10-CM | POA: Diagnosis not present

## 2022-05-17 LAB — BASIC METABOLIC PANEL
BUN/Creatinine Ratio: 21 (ref 10–24)
BUN: 20 mg/dL (ref 8–27)
CO2: 25 mmol/L (ref 20–29)
Calcium: 9.7 mg/dL (ref 8.6–10.2)
Chloride: 101 mmol/L (ref 96–106)
Creatinine, Ser: 0.95 mg/dL (ref 0.76–1.27)
Glucose: 102 mg/dL — ABNORMAL HIGH (ref 70–99)
Potassium: 5.3 mmol/L — ABNORMAL HIGH (ref 3.5–5.2)
Sodium: 138 mmol/L (ref 134–144)
eGFR: 87 mL/min/{1.73_m2} (ref >59)

## 2022-05-30 ENCOUNTER — Encounter: Payer: Self-pay | Admitting: Cardiology

## 2022-06-12 DIAGNOSIS — G4733 Obstructive sleep apnea (adult) (pediatric): Secondary | ICD-10-CM | POA: Diagnosis not present

## 2022-06-21 ENCOUNTER — Telehealth: Payer: Self-pay | Admitting: Cardiology

## 2022-06-21 NOTE — Telephone Encounter (Signed)
Patient would like to know more about patient assistance for medication. Please call him to discuss.

## 2022-06-23 NOTE — Telephone Encounter (Signed)
Called pt to see what he needed the patient assistance for. Pt mention that his insurance for Repatha did not get covered by insurance and would like to know if you could change his medication of repatha for something else.

## 2022-06-24 NOTE — Telephone Encounter (Signed)
Either Praluent or Lequio. See what his insurance will cover

## 2022-06-27 ENCOUNTER — Other Ambulatory Visit: Payer: Self-pay

## 2022-06-27 MED ORDER — REPATHA SURECLICK 140 MG/ML ~~LOC~~ SOAJ: 1.0000 mL | SUBCUTANEOUS | 6 refills | Status: DC

## 2022-06-28 NOTE — Telephone Encounter (Signed)
Pt is approved for Repatha and pt is aware

## 2022-07-27 DIAGNOSIS — H66011 Acute suppurative otitis media with spontaneous rupture of ear drum, right ear: Secondary | ICD-10-CM | POA: Diagnosis not present

## 2022-08-08 DIAGNOSIS — E7800 Pure hypercholesterolemia, unspecified: Secondary | ICD-10-CM | POA: Diagnosis not present

## 2022-08-08 DIAGNOSIS — R7303 Prediabetes: Secondary | ICD-10-CM | POA: Diagnosis not present

## 2022-08-08 DIAGNOSIS — I10 Essential (primary) hypertension: Secondary | ICD-10-CM | POA: Diagnosis not present

## 2022-08-08 DIAGNOSIS — Z9622 Myringotomy tube(s) status: Secondary | ICD-10-CM | POA: Diagnosis not present

## 2022-08-14 DIAGNOSIS — G4733 Obstructive sleep apnea (adult) (pediatric): Secondary | ICD-10-CM | POA: Diagnosis not present

## 2022-08-24 DIAGNOSIS — H7201 Central perforation of tympanic membrane, right ear: Secondary | ICD-10-CM | POA: Diagnosis not present

## 2022-08-24 DIAGNOSIS — H903 Sensorineural hearing loss, bilateral: Secondary | ICD-10-CM | POA: Diagnosis not present

## 2022-08-30 DIAGNOSIS — H524 Presbyopia: Secondary | ICD-10-CM | POA: Diagnosis not present

## 2022-09-14 DIAGNOSIS — G4733 Obstructive sleep apnea (adult) (pediatric): Secondary | ICD-10-CM | POA: Diagnosis not present

## 2022-09-21 DIAGNOSIS — M1712 Unilateral primary osteoarthritis, left knee: Secondary | ICD-10-CM | POA: Diagnosis not present

## 2022-09-28 DIAGNOSIS — M1712 Unilateral primary osteoarthritis, left knee: Secondary | ICD-10-CM | POA: Diagnosis not present

## 2022-10-05 DIAGNOSIS — M1712 Unilateral primary osteoarthritis, left knee: Secondary | ICD-10-CM | POA: Diagnosis not present

## 2022-10-14 DIAGNOSIS — G4733 Obstructive sleep apnea (adult) (pediatric): Secondary | ICD-10-CM | POA: Diagnosis not present

## 2022-10-18 NOTE — Progress Notes (Unsigned)
Primary Physician/Referring:  Kathalene Frames, MD  Patient ID: Scott Salas, male    DOB: [birth year], 72 y.o.   MRN: [id number]  No chief complaint on file.  HPI:    Scott Salas  is a 72 y.o. Caucasian male with chronic stable angina pectoris, chronic dyspnea on exertion, family history of premature coronary disease with father had CABG at age 12, hypertension, hyperlipidemia and hyperglycemia.  Patient has history of statin induced myopathy.  Repatha also caused him to have severe myalgias, hence has been taking it only once a month although was approved for Leqvio.    He now presents for 27-monthoffice visit.  Presently asymptomatic. Patient remains active playing tennis and biking.  Reports home blood pressure readings averaging 125-135/80s mmHg. Patient does report ankle edema since starting amlodipine.  Past Medical History:  Diagnosis Date   Arthritis    Chest pain    Hyperlipidemia    Past Surgical History:  Procedure Laterality Date   acl left knee     left knee   KNEE ARTHROSCOPY     right   TONSILLECTOMY     as child   TOTAL KNEE ARTHROPLASTY Right 07/21/2013   Procedure: RIGHT TOTAL KNEE ARTHROPLASTY;  Surgeon: FGearlean Alf MD;  Location: [facility];  Service: Orthopedics;  Laterality: Right;   Family History  Problem Relation Age of Onset   Heart failure Father    Heart disease Father    Diabetes Father    Cancer Maternal Grandfather    Hypertension Mother    Social History   Tobacco Use   Smoking status: Former    Packs/day: 1.00    Years: 10.00    Total pack years: 10.00    Types: Cigarettes    Quit date: 1982    Years since quitting: 42.0   Smokeless tobacco: Never  Substance Use Topics   Alcohol use: Yes    Comment: occassionally  Marital Status: Married    ROS  Review of Systems  Cardiovascular:  Negative for chest pain, dyspnea on exertion and leg swelling.  Respiratory:  Positive for snoring (on CPAP).    Objective  There were  no vitals taken for this visit.     05/08/2022   12:58 PM 05/08/2022   12:50 PM 02/13/2022   10:00 AM  Vitals with BMI  Height  _0    Weight  193 lbs   BMI  233.00  Systolic [id number] Diastolic 82 76 86  Pulse 59 57 55     Physical Exam Vitals reviewed.  Constitutional:      Appearance: He is well-developed.  Neck:     Thyroid: No thyromegaly.     Vascular: No carotid bruit or JVD.  Cardiovascular:     Rate and Rhythm: Normal rate and regular rhythm.     Pulses: Normal pulses and intact distal pulses.     Heart sounds: No murmur heard.    Early systolic murmur is present at the upper right sternal border.     No gallop.     Comments: No JVD. Pulmonary:     Effort: Pulmonary effort is normal.     Breath sounds: Normal breath sounds.  Abdominal:     General: Bowel sounds are normal.     Palpations: Abdomen is soft.  Musculoskeletal:     Right lower leg: No edema.     Left lower leg: No edema.   Physical exam unchanged compared to  previous office visit.  Laboratory examination:   Lab Results  Component Value Date   NA 138 05/16/2022   K 5.3 (H) 05/16/2022   CO2 25 05/16/2022   GLUCOSE 102 (H) 05/16/2022   BUN 20 05/16/2022   CREATININE 0.95 05/16/2022   CALCIUM 9.7 05/16/2022   EGFR 87 05/16/2022   GFRNONAA >60 09/15/2019    Lipid Panel     Component Value Date/Time   CHOL 147 10/12/2021 0859   TRIG 105 10/12/2021 0859   HDL 66 10/12/2021 0859   LDLCALC 62 10/12/2021 0859   External Labs:   External labs 04/05/2022: ALT 18, AST 20, creatinine 0.8, GFR 94, potassium 5.4, sodium 142  LDL 54, total cholesterol 132, HDL 60, triglycerides 93  A1c 6.0%  12/27/2021: A1c 6.2% BUN 15, creatinine 0.84, GFR >60, potassium 5.2, sodium 138 Total cholesterol 225, LDL 131, HDL 61, triglycerides 196 Lipoprotein a 29.8   Allergies   Allergies  Allergen Reactions   Livalo [Pitavastatin] Other (See Comments)    Myalgia   Repatha [Evolocumab]     Myalgia     Zetia [Ezetimibe]     Myalgia    Crestor [Rosuvastatin]     Other reaction(s): muscle aches   Atorvastatin Other (See Comments)    myalgia    Medications   Current Outpatient Medications:    acetaminophen (TYLENOL) 500 MG tablet, Take 500 mg by mouth every 6 (six) hours as needed., Disp: , Rfl:    amLODipine (NORVASC) 2.5 MG tablet, Take 1 tablet (2.5 mg total) by mouth every evening., Disp: 90 tablet, Rfl: 3   aspirin EC 81 MG tablet, Take 1 tablet (81 mg total) by mouth daily. Swallow whole., Disp: 90 tablet, Rfl: 3   Evolocumab (REPATHA SURECLICK) 295 MG/ML SOAJ, Inject 1 mL into the skin every 30 (thirty) days., Disp: 2 mL, Rfl: 6   fexofenadine (ALLEGRA) 180 MG tablet, , Disp: , Rfl:    fluticasone (FLONASE) 50 MCG/ACT nasal spray, Place 1 spray into both nostrils as needed., Disp: , Rfl:    ibuprofen (ADVIL) 100 MG tablet, Take 100 mg by mouth every 6 (six) hours as needed for fever., Disp: , Rfl:    losartan (COZAAR) 25 MG tablet, Take 1 tablet (25 mg total) by mouth daily., Disp: 90 tablet, Rfl: 3   sildenafil (REVATIO) 20 MG tablet, Take 20 mg by mouth daily as needed., Disp: , Rfl:    Radiology:   Chest x-ray 09/15/2019: Heart size within normal limits. There is no airspace consolidation within the lungs. No evidence of pleural effusion or pneumothorax. No acute bony abnormality. Multilevel thoracic spine anterolateral osteophytes  Cardiac Studies:   Lexiscan Tetrofosmin Stress Test  10/13/2019: Nondiagnostic ECG stress. Normal myocardial perfusion. All segments of left ventricle demonstrated normal wall motion and thickening. Stress LV EF is hyperdynamic 80%.  Stress LV EF: 80%.  Low risk study. No previous exam available for comparison.  Coronary calcium score 03/03/2020: LM: 86 LAD: 356 LCx: 101 RCA: 75. Total Agatston score 619, MESA database percentile 93. Ascending aorta mildly enlarged at 43 mm.  Visualized noncardiac structures within normal  limits.  PCV ECHOCARDIOGRAM COMPLETE 08/08/2021 Left ventricle cavity is normal in size. Moderate concentric hypertrophy of the left ventricle. Normal global wall motion. Normal LV systolic function with EF 59%. Normal diastolic filling pattern. Structurally normal trileaflet aortic valve. Mild (Grade I) aortic regurgitation. Mild (Grade I) mitral regurgitation. Normal right atrial pressure.   EKG  05/08/2022: Sinus rhythm at a  rate of 50 bpm.  Normal axis.  Nonspecific T wave abnormality.  Poor R wave progression, cannot exclude anteroseptal infarct old.  No evidence of ischemia or underlying injury pattern.  10/20/2021: Normal sinus rhythm at rate of 64 bpm without evidence of ischemia.  Normal EKG.  No significant change from 11/17/2020  Assessment     ICD-10-CM   1. Coronary artery disease involving native coronary artery of native heart without angina pectoris  I25.10     2. Essential hypertension  I10     3. Hypercholesteremia  E78.00     4. Aortic root dilatation (HCC)  I77.810      No orders of the defined types were placed in this encounter.  There are no discontinued medications.    No orders of the defined types were placed in this encounter.   Recommendations:   MARTINO TOMPSON  is a 72 y.o. Caucasian male with chronic stable angina pectoris, chronic dyspnea on exertion, family history of premature coronary disease with father had CABG at age 41, hypertension, hyperlipidemia and hyperglycemia.  Also noted to have a 43 mm ascending aorta during coronary calcium score evaluation CT in 2021.  Patient has history of statin induced myopathy.  Repatha also caused him to have severe myalgias, hence has been taking it only once a month although was approved for Leqvio.    1. Coronary artery disease of native artery of native heart with stable angina pectoris (HCC) ***  2. Essential hypertension ***  3. Hypercholesteremia ***  4. Aortic root dilatation [facility]) ***     Patient is otherwise stable from a cardiovascular standpoint.  Follow-up in 6 months, sooner if needed.   Adrian Prows, MD, [facility] 10/18/2022, 9:11 PM Office: [phone] Fax: [phone] Pager: [phone]

## 2022-10-19 ENCOUNTER — Encounter: Payer: Self-pay | Admitting: Cardiology

## 2022-10-19 ENCOUNTER — Ambulatory Visit: Payer: Medicare Other | Admitting: Cardiology

## 2022-10-19 VITALS — BP 133/75 | HR 60 | Resp 16 | Ht 70.0 in | Wt 196.8 lb

## 2022-10-19 DIAGNOSIS — E7800 Pure hypercholesterolemia, unspecified: Secondary | ICD-10-CM

## 2022-10-19 DIAGNOSIS — I25118 Atherosclerotic heart disease of native coronary artery with other forms of angina pectoris: Secondary | ICD-10-CM | POA: Diagnosis not present

## 2022-10-19 DIAGNOSIS — I77810 Thoracic aortic ectasia: Secondary | ICD-10-CM | POA: Diagnosis not present

## 2022-10-19 DIAGNOSIS — I10 Essential (primary) hypertension: Secondary | ICD-10-CM

## 2022-10-19 MED ORDER — AMLODIPINE BESYLATE 10 MG PO TABS: 10.0000 mg | ORAL_TABLET | Freq: Every day | ORAL | 3 refills | Status: DC

## 2022-10-19 MED ORDER — HYDROCHLOROTHIAZIDE 12.5 MG PO CAPS: 12.5000 mg | ORAL_CAPSULE | ORAL | 2 refills | Status: DC

## 2022-10-19 MED ORDER — REPATHA SURECLICK 140 MG/ML ~~LOC~~ SOAJ: 1.0000 mL | SUBCUTANEOUS | 3 refills | Status: DC

## 2022-10-20 ENCOUNTER — Ambulatory Visit: Payer: Medicare Other | Admitting: Cardiology

## 2022-11-01 DIAGNOSIS — E7800 Pure hypercholesterolemia, unspecified: Secondary | ICD-10-CM | POA: Diagnosis not present

## 2022-11-01 DIAGNOSIS — I25118 Atherosclerotic heart disease of native coronary artery with other forms of angina pectoris: Secondary | ICD-10-CM | POA: Diagnosis not present

## 2022-11-02 ENCOUNTER — Ambulatory Visit: Payer: Medicare Other

## 2022-11-02 DIAGNOSIS — I25118 Atherosclerotic heart disease of native coronary artery with other forms of angina pectoris: Secondary | ICD-10-CM

## 2022-11-02 DIAGNOSIS — I77810 Thoracic aortic ectasia: Secondary | ICD-10-CM

## 2022-11-02 LAB — BASIC METABOLIC PANEL
BUN/Creatinine Ratio: 19 (ref 10–24)
BUN: 18 mg/dL (ref 8–27)
CO2: 24 mmol/L (ref 20–29)
Calcium: 9.9 mg/dL (ref 8.6–10.2)
Chloride: 99 mmol/L (ref 96–106)
Creatinine, Ser: 0.95 mg/dL (ref 0.76–1.27)
Glucose: 100 mg/dL — ABNORMAL HIGH (ref 70–99)
Potassium: 4.7 mmol/L (ref 3.5–5.2)
Sodium: 138 mmol/L (ref 134–144)
eGFR: 87 mL/min/{1.73_m2} (ref >59)

## 2022-11-02 LAB — LIPID PANEL WITH LDL/HDL RATIO
Cholesterol, Total: 119 mg/dL (ref 100–199)
HDL: 65 mg/dL (ref >39)
LDL Chol Calc (NIH): 39 mg/dL (ref 0–99)
LDL/HDL Ratio: 0.6 ratio (ref 0.0–3.6)
Triglycerides: 71 mg/dL (ref 0–149)
VLDL Cholesterol Cal: 15 mg/dL (ref 5–40)

## 2022-11-02 LAB — HIGH SENSITIVITY CRP: CRP, High Sensitivity: 3.69 mg/L — ABNORMAL HIGH (ref 0.00–3.00)

## 2022-11-13 DIAGNOSIS — G4733 Obstructive sleep apnea (adult) (pediatric): Secondary | ICD-10-CM | POA: Diagnosis not present

## 2022-11-14 ENCOUNTER — Encounter: Payer: Self-pay | Admitting: Cardiology

## 2022-11-14 ENCOUNTER — Other Ambulatory Visit: Payer: Self-pay

## 2022-11-14 DIAGNOSIS — I10 Essential (primary) hypertension: Secondary | ICD-10-CM

## 2022-11-14 MED ORDER — HYDROCHLOROTHIAZIDE 12.5 MG PO CAPS: 12.5000 mg | ORAL_CAPSULE | ORAL | 0 refills | Status: DC

## 2022-11-14 NOTE — Telephone Encounter (Signed)
Please refill 90 with 3 refills\

## 2022-11-14 NOTE — Telephone Encounter (Signed)
From patient.

## 2023-01-15 DIAGNOSIS — M1712 Unilateral primary osteoarthritis, left knee: Secondary | ICD-10-CM | POA: Diagnosis not present

## 2023-01-16 DIAGNOSIS — K080 Exfoliation of teeth due to systemic causes: Secondary | ICD-10-CM | POA: Diagnosis not present

## 2023-01-30 DIAGNOSIS — K5730 Diverticulosis of large intestine without perforation or abscess without bleeding: Secondary | ICD-10-CM | POA: Diagnosis not present

## 2023-01-30 DIAGNOSIS — Z86010 Personal history of colonic polyps: Secondary | ICD-10-CM | POA: Diagnosis not present

## 2023-01-30 DIAGNOSIS — Z09 Encounter for follow-up examination after completed treatment for conditions other than malignant neoplasm: Secondary | ICD-10-CM | POA: Diagnosis not present

## 2023-01-30 DIAGNOSIS — D122 Benign neoplasm of ascending colon: Secondary | ICD-10-CM | POA: Diagnosis not present

## 2023-01-30 DIAGNOSIS — K648 Other hemorrhoids: Secondary | ICD-10-CM | POA: Diagnosis not present

## 2023-02-01 DIAGNOSIS — D122 Benign neoplasm of ascending colon: Secondary | ICD-10-CM | POA: Diagnosis not present

## 2023-02-02 DIAGNOSIS — M1712 Unilateral primary osteoarthritis, left knee: Secondary | ICD-10-CM | POA: Diagnosis not present

## 2023-02-08 DIAGNOSIS — Z1331 Encounter for screening for depression: Secondary | ICD-10-CM | POA: Diagnosis not present

## 2023-02-08 DIAGNOSIS — G4733 Obstructive sleep apnea (adult) (pediatric): Secondary | ICD-10-CM | POA: Diagnosis not present

## 2023-02-08 DIAGNOSIS — Z79899 Other long term (current) drug therapy: Secondary | ICD-10-CM | POA: Diagnosis not present

## 2023-02-08 DIAGNOSIS — Z125 Encounter for screening for malignant neoplasm of prostate: Secondary | ICD-10-CM | POA: Diagnosis not present

## 2023-02-08 DIAGNOSIS — I10 Essential (primary) hypertension: Secondary | ICD-10-CM | POA: Diagnosis not present

## 2023-02-08 DIAGNOSIS — Z0000 Encounter for general adult medical examination without abnormal findings: Secondary | ICD-10-CM | POA: Diagnosis not present

## 2023-02-08 DIAGNOSIS — E7800 Pure hypercholesterolemia, unspecified: Secondary | ICD-10-CM | POA: Diagnosis not present

## 2023-02-08 DIAGNOSIS — E663 Overweight: Secondary | ICD-10-CM | POA: Diagnosis not present

## 2023-02-08 DIAGNOSIS — R7309 Other abnormal glucose: Secondary | ICD-10-CM | POA: Diagnosis not present

## 2023-03-10 DIAGNOSIS — G4733 Obstructive sleep apnea (adult) (pediatric): Secondary | ICD-10-CM | POA: Diagnosis not present

## 2023-03-30 DIAGNOSIS — R972 Elevated prostate specific antigen [PSA]: Secondary | ICD-10-CM | POA: Diagnosis not present

## 2023-04-10 DIAGNOSIS — G4733 Obstructive sleep apnea (adult) (pediatric): Secondary | ICD-10-CM | POA: Diagnosis not present

## 2023-04-11 DIAGNOSIS — F5104 Psychophysiologic insomnia: Secondary | ICD-10-CM | POA: Diagnosis not present

## 2023-04-11 DIAGNOSIS — G4733 Obstructive sleep apnea (adult) (pediatric): Secondary | ICD-10-CM | POA: Diagnosis not present

## 2023-04-23 ENCOUNTER — Encounter: Payer: Self-pay | Admitting: Cardiology

## 2023-04-23 ENCOUNTER — Ambulatory Visit: Payer: Medicare Other | Admitting: Cardiology

## 2023-04-23 VITALS — BP 135/86 | HR 64 | Resp 16 | Ht 70.0 in | Wt 195.0 lb

## 2023-04-23 DIAGNOSIS — R931 Abnormal findings on diagnostic imaging of heart and coronary circulation: Secondary | ICD-10-CM | POA: Diagnosis not present

## 2023-04-23 DIAGNOSIS — E7800 Pure hypercholesterolemia, unspecified: Secondary | ICD-10-CM | POA: Diagnosis not present

## 2023-04-23 DIAGNOSIS — I77810 Thoracic aortic ectasia: Secondary | ICD-10-CM | POA: Diagnosis not present

## 2023-04-23 DIAGNOSIS — I10 Essential (primary) hypertension: Secondary | ICD-10-CM | POA: Diagnosis not present

## 2023-04-23 HISTORY — DX: Essential (primary) hypertension: I10

## 2023-04-23 MED ORDER — LOSARTAN POTASSIUM 50 MG PO TABS: 50.0000 mg | ORAL_TABLET | Freq: Every evening | ORAL | 2 refills | Status: DC

## 2023-04-23 NOTE — Progress Notes (Signed)
Primary Physician/Referring:  Emilio Aspen, MD  Patient ID: Scott Salas, male    DOB: 1951-05-11, 72 y.o.   MRN: [id number]  Chief Complaint  Patient presents with  . Coronary Artery Disease  . Hypertension  . Hyperlipidemia  . Follow-up    6 months   HPI:    Scott Salas  is a 72 y.o. Caucasian male with chronic stable angina pectoris, chronic dyspnea on exertion, symptoms completely resolved on medical therapy, family history of premature coronary disease with father had CABG at age 67, hypertension, hyperlipidemia and hyperglycemia, coronary calcium score in the 91st percentile, 43 mm ascending aorta during coronary calcium score evaluation CT in 2021.  Patient has history of statin induced myopathy.  Repatha also caused him to have severe myalgias, hence has been taking it only once a month although was approved for Leqvio.     He now presents for 70-month office visit.  Presently asymptomatic. Patient remains active playing tennis and biking.     Past Medical History:  Diagnosis Date  . Arthritis   . Chest pain   . Essential hypertension 04/23/2023  . Hyperlipidemia    Past Surgical History:  Procedure Laterality Date  . acl left knee     left knee  . KNEE ARTHROSCOPY     right  . TONSILLECTOMY     as child  . TOTAL KNEE ARTHROPLASTY Right 07/21/2013   Procedure: RIGHT TOTAL KNEE ARTHROPLASTY;  Surgeon: Loanne Drilling, MD;  Location: [facility];  Service: Orthopedics;  Laterality: Right;   Family History  Problem Relation Age of Onset  . Heart failure Father   . Heart disease Father   . Diabetes Father   . Cancer Maternal Grandfather   . Hypertension Mother    Social History   Tobacco Use  . Smoking status: Former    Packs/day: 1.00    Years: 10.00    Additional pack years: 0.00    Total pack years: 10.00    Types: Cigarettes    Quit date: 1982    Years since quitting: 42.5  . Smokeless tobacco: Never  Substance Use Topics  . Alcohol use: Yes     Comment: occassionally  Marital Status: Married    ROS  Review of Systems  Cardiovascular:  Negative for chest pain, dyspnea on exertion and leg swelling.  Respiratory:  Positive for snoring (on CPAP).    Objective  Blood pressure 135/86, pulse 64, resp. rate 16, height 5\' 10"  (1.778 m), weight 195 lb (88.5 kg), SpO2 96 %.     04/23/2023    9:31 AM 10/19/2022    8:46 AM 05/08/2022   12:58 PM  Vitals with BMI  Height 5\' 10"  5\' 10"    Weight 195 lbs 196 lbs 13 oz   BMI 27.98 28.24   Systolic 135 133 119  Diastolic 86 75 82  Pulse 64 60 59     Physical Exam Neck:     Vascular: No carotid bruit or JVD.  Cardiovascular:     Rate and Rhythm: Normal rate and regular rhythm.     Pulses: Intact distal pulses.     Heart sounds: Murmur heard.     Midsystolic murmur is present with a grade of 2/6 at the upper right sternal border.     No gallop.  Pulmonary:     Effort: Pulmonary effort is normal.     Breath sounds: Normal breath sounds.  Abdominal:     General: Bowel  sounds are normal.     Palpations: Abdomen is soft.  Musculoskeletal:     Right lower leg: No edema.     Left lower leg: No edema.    Laboratory examination:   Lab Results  Component Value Date   NA 138 11/01/2022   K 4.7 11/01/2022   CO2 24 11/01/2022   GLUCOSE 100 (H) 11/01/2022   BUN 18 11/01/2022   CREATININE 0.95 11/01/2022   CALCIUM 9.9 11/01/2022   EGFR 87 11/01/2022   GFRNONAA >60 09/15/2019       Latest Ref Rng & Units 09/15/2019    3:26 PM 07/22/2013    4:30 AM 07/16/2013    8:55 AM  CBC  WBC 4.0 - 10.5 K/uL 7.2  17.3  6.8   Hemoglobin 13.0 - 17.0 g/dL 16.1  09.6  04.5   Hematocrit 39.0 - 52.0 % 45.7  35.4  41.8   Platelets 150 - 400 K/uL 252  301  286     Lipid Panel     Component Value Date/Time   CHOL 119 11/01/2022 0855   TRIG 71 11/01/2022 0855   HDL 65 11/01/2022 0855   LDLCALC 39 11/01/2022 0855   External Labs:   A1C 5.900 % 02/08/2023  Hemoglobin 13.700 g/d 02/08/2023  TSH  2.560 02/08/2023  12/27/2021:  A1c 6.2%  Lipoprotein a 29.8   Radiology:   Chest x-ray 09/15/2019: Heart size within normal limits. There is no airspace consolidation within the lungs. No evidence of pleural effusion or pneumothorax. No acute bony abnormality. Multilevel thoracic spine anterolateral osteophytes  Cardiac Studies:   Lexiscan Tetrofosmin Stress Test  10/13/2019: Nondiagnostic ECG stress. Normal myocardial perfusion. All segments of left ventricle demonstrated normal wall motion and thickening. Stress LV EF is hyperdynamic 80%.  Stress LV EF: 80%.  Low risk study. No previous exam available for comparison.  Coronary calcium score 03/03/2020: LM: 86 LAD: 356 LCx: 101 RCA: 75. Total Agatston score 619, MESA database percentile 93. Ascending aorta mildly enlarged at 43 mm.  Visualized noncardiac structures within normal limits.  PCV ECHOCARDIOGRAM COMPLETE 11/02/2022  Narrative Echocardiogram 11/02/2022: Normal LV systolic function with visual EF 60-65%. Left ventricle cavity is normal in size. Normal left ventricular wall thickness, presence of a septal bulge. Normal global wall motion. Normal diastolic filling pattern, normal LAP. Aortic valve sclerosis without stenosis. Mild tricuspid regurgitation. No evidence of pulmonary hypertension. Compared to 08/08/2021 Mild MR/AR is not appreciated on current study otherwise no significant change.   EKG   EKG 04/23/2023: Normal sinus rhythm with rate of 60 bpm, left atrial enlargement, normal axis.  Poor R progression, probably normal variant.  Compared to 10/19/2022, no significant change.   Allergies & Medications:   Allergies  Allergen Reactions  . Livalo [Pitavastatin] Other (See Comments)    Myalgia  . Repatha [Evolocumab]     Myalgia   . Zetia [Ezetimibe]     Myalgia   . Crestor [Rosuvastatin]     Other reaction(s): muscle aches  . Atorvastatin Other (See Comments)    myalgia    Current Outpatient  Medications:  .  acetaminophen (TYLENOL) 500 MG tablet, Take 500 mg by mouth every 6 (six) hours as needed., Disp: , Rfl:  .  aspirin EC 81 MG tablet, Take 1 tablet (81 mg total) by mouth daily. Swallow whole., Disp: 90 tablet, Rfl: 3 .  Evolocumab (REPATHA SURECLICK) 140 MG/ML SOAJ, Inject 140 mg into the skin every 30 (thirty) days., Disp: 3 mL, Rfl:  3 .  fexofenadine (ALLEGRA) 180 MG tablet, , Disp: , Rfl:  .  fluticasone (FLONASE) 50 MCG/ACT nasal spray, Place 1 spray into both nostrils as needed., Disp: , Rfl:  .  ibuprofen (ADVIL) 100 MG tablet, Take 100 mg by mouth every 6 (six) hours as needed for fever., Disp: , Rfl:  .  losartan (COZAAR) 50 MG tablet, Take 1 tablet (50 mg total) by mouth every evening., Disp: 30 tablet, Rfl: 2 .  sildenafil (REVATIO) 20 MG tablet, Take 20 mg by mouth daily as needed., Disp: , Rfl:    Assessment     ICD-10-CM   1. Elevated coronary artery calcium score 03/03/2020: Total Agatston score 619, MESA database percentile 93.  R93.1 EKG 12-Lead    CT CARDIAC SCORING (SELF PAY ONLY)    losartan (COZAAR) 50 MG tablet    2. Essential hypertension  I10 losartan (COZAAR) 50 MG tablet    Basic metabolic panel    3. Aortic root dilatation (HCC)  I77.810 CT CARDIAC SCORING (SELF PAY ONLY)    losartan (COZAAR) 50 MG tablet    4. Hypercholesteremia  E78.00      Meds ordered this encounter  Medications  . losartan (COZAAR) 50 MG tablet    Sig: Take 1 tablet (50 mg total) by mouth every evening.    Dispense:  30 tablet    Refill:  2    Discontinue Amlodipine   Medications Discontinued During This Encounter  Medication Reason  . amLODipine (NORVASC) 10 MG tablet Dose change  . hydrochlorothiazide (MICROZIDE) 12.5 MG capsule Change in therapy  . amLODipine (NORVASC) 5 MG tablet Change in therapy      Orders Placed This Encounter  Procedures  . CT CARDIAC SCORING (SELF PAY ONLY)    Standing Status:   Future    Standing Expiration Date:   06/24/2023     Order Specific Question:   Preferred imaging location?    Answer:   External    Order Specific Question:   Radiology Contrast Protocol - do NOT remove file path    Answer:   [email or web address]  . Basic metabolic panel  . EKG 12-Lead    Recommendations:   Scott Salas  is a 72 y.o. Caucasian male with chronic stable angina pectoris, chronic dyspnea on exertion, symptoms completely resolved on medical therapy, family history of premature coronary disease with father had CABG at age 41, hypertension, hyperlipidemia and hyperglycemia, coronary calcium score in the 91st percentile, 43 mm ascending aorta during coronary calcium score evaluation CT in 2021.  Patient has history of statin induced myopathy.  Repatha also caused him to have severe myalgias, hence has been taking it only once a month although was approved for Leqvio.    1. Elevated coronary artery calcium score 03/03/2020: Total Agatston score 619, MESA database percentile 93. Patient has markedly elevated coronary calcium score in the 90th percentile, presently on Repatha once a month with excellent control of LDL.  Continue the same for now.  He is also on aspirin for secondary prevention. Although on amlodipine, blood pressure is not well-controlled.  Also in view of aortic root dilatation and also coronary artery disease, we will switch him to losartan 50 mg daily, will obtain BMP in 2 to 3 weeks.  Patient will continue to monitor his blood pressure closely and if it is still >130/80 mmHg he will message me so we can increase the dose of the losartan to 100 mg daily. - EKG 12-Lead -  CT CARDIAC SCORING (SELF PAY ONLY); Future - losartan (COZAAR) 50 MG tablet; Take 1 tablet (50 mg total) by mouth every evening.  Dispense: 30 tablet; Refill: 2  2. Essential hypertension As dictated above, will discontinue amlodipine and switch him to losartan. - losartan (COZAAR) 50 MG tablet; Take 1 tablet (50 mg  total) by mouth every evening.  Dispense: 30 tablet; Refill: 2 - Basic metabolic panel  3. Aortic root dilatation (HCC) I will obtain coronary calcium score again to follow-up on aortic root dilatation.  If indeed aortic root dilatation is confirmed, I may consider CT angiogram of the chest.  I reviewed his echocardiogram that was performed recently personally images reviewed, aortic size appears to be completely normal. - CT CARDIAC SCORING (SELF PAY ONLY); Future - losartan (COZAAR) 50 MG tablet; Take 1 tablet (50 mg total) by mouth every evening.  Dispense: 30 tablet; Refill: 2  4. Hypercholesteremia I reviewed his labs, 6 months ago his lipids under excellent control although he is only on once a month Repatha.  Continue the same.  Patient continues to be very active, he bicycled past weekend for 10 miles, played 4 hours of tennis and also walked 18 holes of golf without any dyspnea or chest pain.  I will see him back in 2 months for follow-up.   Yates Decamp, MD, [facility] 04/23/2023, 9:56 AM Office: [phone] Fax: [phone] Pager: [phone]

## 2023-05-02 DIAGNOSIS — G4733 Obstructive sleep apnea (adult) (pediatric): Secondary | ICD-10-CM | POA: Diagnosis not present

## 2023-05-04 DIAGNOSIS — I10 Essential (primary) hypertension: Secondary | ICD-10-CM | POA: Diagnosis not present

## 2023-05-05 ENCOUNTER — Encounter: Payer: Self-pay | Admitting: Cardiology

## 2023-05-05 DIAGNOSIS — I10 Essential (primary) hypertension: Secondary | ICD-10-CM

## 2023-05-05 LAB — BASIC METABOLIC PANEL
BUN/Creatinine Ratio: 23 (ref 10–24)
BUN: 20 mg/dL (ref 8–27)
CO2: 22 mmol/L (ref 20–29)
Calcium: 10 mg/dL (ref 8.6–10.2)
Chloride: 100 mmol/L (ref 96–106)
Creatinine, Ser: 0.87 mg/dL (ref 0.76–1.27)
Glucose: 89 mg/dL (ref 70–99)
Potassium: 4.6 mmol/L (ref 3.5–5.2)
Sodium: 139 mmol/L (ref 134–144)
eGFR: 93 mL/min/{1.73_m2} (ref >59)

## 2023-05-07 MED ORDER — AMLODIPINE BESYLATE 5 MG PO TABS: 5.0000 mg | ORAL_TABLET | Freq: Every day | ORAL | 3 refills | Status: DC

## 2023-05-07 NOTE — Telephone Encounter (Signed)
ICD-10-CM   1. Essential hypertension  I10 amLODipine (NORVASC) 5 MG tablet     Meds ordered this encounter  Medications   amLODipine (NORVASC) 5 MG tablet    Sig: Take 1 tablet (5 mg total) by mouth daily.    Dispense:  90 tablet    Refill:  3

## 2023-05-07 NOTE — Telephone Encounter (Signed)
From patient.

## 2023-05-11 ENCOUNTER — Other Ambulatory Visit (HOSPITAL_COMMUNITY): Payer: PPO

## 2023-05-21 ENCOUNTER — Ambulatory Visit (HOSPITAL_COMMUNITY)
Admission: RE | Admit: 2023-05-21 | Discharge: 2023-05-21 | Disposition: A | Discharge status: Home / Self Care | Payer: PPO | Source: Ambulatory Visit | Attending: Cardiology | Admitting: Cardiology

## 2023-05-21 DIAGNOSIS — I77810 Thoracic aortic ectasia: Secondary | ICD-10-CM | POA: Insufficient documentation

## 2023-05-21 DIAGNOSIS — R931 Abnormal findings on diagnostic imaging of heart and coronary circulation: Secondary | ICD-10-CM | POA: Insufficient documentation

## 2023-05-22 DIAGNOSIS — H6123 Impacted cerumen, bilateral: Secondary | ICD-10-CM | POA: Diagnosis not present

## 2023-05-22 NOTE — Progress Notes (Signed)
Coronary calcium score 05/21/2023: Total coronary calcium score 827, MESA database 81 percentile. LM 69.2 LAD 505 LCx 127 RCA 126 Ascending aorta: Dilated at 44 mm.  Aortic atherosclerosis.

## 2023-05-25 DIAGNOSIS — M1712 Unilateral primary osteoarthritis, left knee: Secondary | ICD-10-CM | POA: Diagnosis not present

## 2023-05-26 NOTE — Progress Notes (Signed)
No significant extracardiac abnormality.

## 2023-06-25 ENCOUNTER — Encounter: Payer: Self-pay | Admitting: Cardiology

## 2023-06-25 ENCOUNTER — Ambulatory Visit: Payer: Medicare Other | Admitting: Cardiology

## 2023-06-25 VITALS — BP 125/80 | HR 68 | Resp 16 | Ht 70.0 in | Wt 191.0 lb

## 2023-06-25 DIAGNOSIS — R931 Abnormal findings on diagnostic imaging of heart and coronary circulation: Secondary | ICD-10-CM | POA: Diagnosis not present

## 2023-06-25 DIAGNOSIS — G720 Drug-induced myopathy: Secondary | ICD-10-CM | POA: Diagnosis not present

## 2023-06-25 DIAGNOSIS — I10 Essential (primary) hypertension: Secondary | ICD-10-CM | POA: Diagnosis not present

## 2023-06-25 DIAGNOSIS — E7800 Pure hypercholesterolemia, unspecified: Secondary | ICD-10-CM | POA: Diagnosis not present

## 2023-06-25 DIAGNOSIS — I77810 Thoracic aortic ectasia: Secondary | ICD-10-CM

## 2023-06-25 NOTE — Progress Notes (Signed)
Primary Physician/Referring:  Emilio Aspen, MD  Patient ID: Scott Salas, male    DOB: 01/17/1951, 72 y.o.   MRN: [id number]  Chief Complaint  Patient presents with   Elevated coronary artery calcium score 03/03/2020: Total Aga   Coronary artery disease of native artery of native heart wi   Follow-up    2 months   HPI:    Scott Salas  is a 72 y.o. Caucasian male with chronic stable angina pectoris, chronic dyspnea on exertion, symptoms completely resolved on medical therapy, family history of premature coronary disease with father had CABG at age 57, hypertension, hyperlipidemia and hyperglycemia, coronary calcium score in the 91st percentile, 43 mm ascending aorta during coronary calcium score evaluation CT in 2021.  Patient has history of statin induced myopathy.  Repatha also caused him to have severe myalgias, hence has been taking it only once a month although was approved for Leqvio.     This is a 39-month follow-up visit, I repeated coronary calcium score to specifically follow-up on ascending aortic aneurysm.  Presently asymptomatic. Patient remains active playing tennis and biking.     Past Medical History:  Diagnosis Date   Arthritis    Chest pain    Essential hypertension 04/23/2023   Hyperlipidemia    Past Surgical History:  Procedure Laterality Date   acl left knee     left knee   KNEE ARTHROSCOPY     right   TONSILLECTOMY     as child   TOTAL KNEE ARTHROPLASTY Right 07/21/2013   Procedure: RIGHT TOTAL KNEE ARTHROPLASTY;  Surgeon: Loanne Drilling, MD;  Location: [facility];  Service: Orthopedics;  Laterality: Right;   Family History  Problem Relation Age of Onset   Heart failure Father    Heart disease Father    Diabetes Father    Cancer Maternal Grandfather    Hypertension Mother    Social History   Tobacco Use   Smoking status: Former    Current packs/day: 0.00    Average packs/day: 1 pack/day for 10.0 years (10.0 ttl pk-yrs)    Types:  Cigarettes    Start date: 89    Quit date: 1982    Years since quitting: 42.7   Smokeless tobacco: Never  Substance Use Topics   Alcohol use: Yes    Comment: occassionally  Marital Status: Married    ROS  Review of Systems  Cardiovascular:  Negative for chest pain, dyspnea on exertion and leg swelling.  Respiratory:  Positive for snoring (on CPAP).    Objective  Blood pressure 125/80, pulse 68, resp. rate 16, height 5\' 10"  (1.778 m), weight 191 lb (86.6 kg), SpO2 96%.     06/25/2023   10:35 AM 04/23/2023    9:31 AM 10/19/2022    8:46 AM  Vitals with BMI  Height 5\' 10"  5\' 10"  5\' 10"   Weight 191 lbs 195 lbs 196 lbs 13 oz  BMI 27.41 27.98 28.24  Systolic 125 135 409  Diastolic 80 86 75  Pulse 68 64 60     Physical Exam Neck:     Vascular: No carotid bruit or JVD.  Cardiovascular:     Rate and Rhythm: Normal rate and regular rhythm.     Pulses: Intact distal pulses.     Heart sounds: Murmur heard.     Midsystolic murmur is present with a grade of 2/6 at the upper right sternal border.     No gallop.  Pulmonary:  Effort: Pulmonary effort is normal.     Breath sounds: Normal breath sounds.  Abdominal:     General: Bowel sounds are normal.     Palpations: Abdomen is soft.  Musculoskeletal:     Right lower leg: No edema.     Left lower leg: No edema.     Laboratory examination:   Lab Results  Component Value Date   NA 139 05/04/2023   K 4.6 05/04/2023   CO2 22 05/04/2023   GLUCOSE 89 05/04/2023   BUN 20 05/04/2023   CREATININE 0.87 05/04/2023   CALCIUM 10.0 05/04/2023   EGFR 93 05/04/2023   GFRNONAA >60 09/15/2019       Latest Ref Rng & Units 09/15/2019    3:26 PM 07/22/2013    4:30 AM 07/16/2013    8:55 AM  CBC  WBC 4.0 - 10.5 K/uL 7.2  17.3  6.8   Hemoglobin 13.0 - 17.0 g/dL 95.2  84.1  32.4   Hematocrit 39.0 - 52.0 % 45.7  35.4  41.8   Platelets 150 - 400 K/uL 252  301  286     Lipid Panel     Component Value Date/Time   CHOL 119 11/01/2022 0855    TRIG 71 11/01/2022 0855   HDL 65 11/01/2022 0855   LDLCALC 39 11/01/2022 0855   External Labs:   A1C 5.900 % 02/08/2023  Hemoglobin 13.700 g/d 02/08/2023  TSH 2.560 02/08/2023  12/27/2021:  A1c 6.2%  Lipoprotein a 29.8   Radiology:   Chest x-ray 09/15/2019: Heart size within normal limits. There is no airspace consolidation within the lungs. No evidence of pleural effusion or pneumothorax. No acute bony abnormality. Multilevel thoracic spine anterolateral osteophytes  Cardiac Studies:   Lexiscan Tetrofosmin Stress Test  10/13/2019: Nondiagnostic ECG stress. Normal myocardial perfusion. All segments of left ventricle demonstrated normal wall motion and thickening. Stress LV EF is hyperdynamic 80%.  Stress LV EF: 80%.  Low risk study. No previous exam available for comparison.  PCV ECHOCARDIOGRAM COMPLETE 11/02/2022  Narrative Echocardiogram 11/02/2022: Normal LV systolic function with visual EF 60-65%. Left ventricle cavity is normal in size. Normal left ventricular wall thickness, presence of a septal bulge. Normal global wall motion. Normal diastolic filling pattern, normal LAP. Aortic valve sclerosis without stenosis. Mild tricuspid regurgitation. No evidence of pulmonary hypertension. Compared to 08/08/2021 Mild MR/AR is not appreciated on current study otherwise no significant change.   Coronary calcium score 05/21/2023: Total coronary calcium score 827, MESA database 81 percentile. LM 69.2 LAD 505 LCx 127 RCA 126 Ascending aorta: Dilated at 44 mm.  Aortic atherosclerosis. Compared to 03/03/2020, no significant change in ascending aortic aneurysm, coronary calcium score is increased from [phone] and MESA database percentile has reduced from 93 to 81.  EKG   EKG 04/23/2023: Normal sinus rhythm with rate of 60 bpm, left atrial enlargement, normal axis.  Poor R progression, probably normal variant.  Compared to 10/19/2022, no significant change.   Allergies &  Medications:   Allergies  Allergen Reactions   Livalo [Pitavastatin] Other (See Comments)    Myalgia   Losartan Other (See Comments)    Leg cramps   Repatha [Evolocumab]     Myalgia    Zetia [Ezetimibe]     Myalgia    Crestor [Rosuvastatin]     Other reaction(s): muscle aches   Atorvastatin Other (See Comments)    myalgia    Current Outpatient Medications:    acetaminophen (TYLENOL) 500 MG tablet, Take 500 mg by  mouth every 6 (six) hours as needed., Disp: , Rfl:    amLODipine (NORVASC) 5 MG tablet, Take 1 tablet (5 mg total) by mouth daily., Disp: 90 tablet, Rfl: 3   aspirin EC 81 MG tablet, Take 1 tablet (81 mg total) by mouth daily. Swallow whole., Disp: 90 tablet, Rfl: 3   Evolocumab (REPATHA SURECLICK) 140 MG/ML SOAJ, Inject 140 mg into the skin every 30 (thirty) days., Disp: 3 mL, Rfl: 3   fexofenadine (ALLEGRA) 180 MG tablet, , Disp: , Rfl:    fluticasone (FLONASE) 50 MCG/ACT nasal spray, Place 1 spray into both nostrils as needed., Disp: , Rfl:    ibuprofen (ADVIL) 100 MG tablet, Take 100 mg by mouth every 6 (six) hours as needed for fever., Disp: , Rfl:    sildenafil (REVATIO) 20 MG tablet, Take 20 mg by mouth daily as needed., Disp: , Rfl:    Assessment     ICD-10-CM   1. Elevated coronary artery calcium score 03/03/2020: Total Agatston score 619, MESA database percentile 93.  R93.1     2. Essential hypertension  I10     3. Hypercholesteremia  E78.00     4. Statin myopathy  G72.0    T46.6X5A     5. Aortic root dilatation (HCC)  I77.810       No orders of the defined types were placed in this encounter.  Medications Discontinued During This Encounter  Medication Reason   losartan (COZAAR) 50 MG tablet       No orders of the defined types were placed in this encounter.   Recommendations:   Scott Salas  is a 72 y.o. Caucasian male with chronic stable angina pectoris, chronic dyspnea on exertion, symptoms completely resolved on medical therapy, family  history of premature coronary disease with father had CABG at age 53, hypertension, hyperlipidemia and hyperglycemia, coronary calcium score in the 91st percentile, 43 mm ascending aorta during coronary calcium score evaluation CT in 2021.  Patient has history of statin induced myopathy.  Repatha also caused him to have severe myalgias, hence has been taking it only once a month.  1. Elevated coronary artery calcium score 03/03/2020: Total Agatston score 619, MESA database percentile 93. I reviewed the results of the CT scan of the chest specifically performed for follow-up of aortic aneurysm, since 2021, no change in aortic size.  Hence I do not think he needs CT angiogram, will consider repeating brain CT of the chest again next year.  Aneurysm appears to be very small.  He remains stable otherwise.  2. Essential hypertension Blood pressure is well-controlled, I tried to place him on losartan in view of ascending aortic aneurysm and also hypertension however patient 14 significant leg cramps since his back on amlodipine.  3. Hypercholesteremia Reviewed his lipids, excellent control of LDL with only once a month dose of Repatha, continue the same.  4. Statin myopathy Patient does have statin myopathy and has not been able to tolerate statins in the past.  5. Aortic root dilatation (HCC) As dictated above, aortic root dilatation has remained stable since 2021, will follow this up with a repeat scan probably in a year or maybe 2.  I will place a reminder for myself and I would like to see him back in 1 year for follow-up.    Yates Decamp, MD, [facility] 06/25/2023, 11:22 AM Office: [phone] Fax: [phone] Pager: [phone]

## 2023-07-02 ENCOUNTER — Encounter: Payer: Self-pay | Admitting: Cardiology

## 2023-07-08 DIAGNOSIS — H65191 Other acute nonsuppurative otitis media, right ear: Secondary | ICD-10-CM | POA: Diagnosis not present

## 2023-07-13 ENCOUNTER — Telehealth: Payer: Self-pay

## 2023-07-13 ENCOUNTER — Telehealth: Payer: Self-pay | Admitting: Cardiology

## 2023-07-13 ENCOUNTER — Other Ambulatory Visit (HOSPITAL_COMMUNITY): Payer: Self-pay

## 2023-07-13 DIAGNOSIS — E7800 Pure hypercholesterolemia, unspecified: Secondary | ICD-10-CM

## 2023-07-13 DIAGNOSIS — I25118 Atherosclerotic heart disease of native coronary artery with other forms of angina pectoris: Secondary | ICD-10-CM

## 2023-07-13 NOTE — Telephone Encounter (Signed)
PA denied, call patient to discuss this further.

## 2023-07-13 NOTE — Telephone Encounter (Signed)
Pt c/o medication issue:  1. Name of Medication:   Evolocumab (REPATHA SURECLICK) 140 MG/ML SOAJ    2. How are you currently taking this medication (dosage and times per day)?    3. Are you having a reaction (difficulty breathing--STAT)? no  4. What is your medication issue? Called to say the medication was denied. Due to not receiving clinic notes for the reason for the medication. Please advise

## 2023-07-13 NOTE — Telephone Encounter (Signed)
Will forward this message to our PharmD team for further assistance and management with pts Repatha PA.

## 2023-07-13 NOTE — Telephone Encounter (Signed)
Pharmacy Patient Advocate Encounter   Received notification from Physician's Office that prior authorization for REPATHA is required/requested.   Insurance verification completed.   The patient is insured through [facility]  .   Per test claim: PA required; PA submitted to [employer] [place] MED[employer] via CoverMyMeds Key/confirmation #/EOC [id number] Status is pending

## 2023-07-13 NOTE — Telephone Encounter (Signed)
Scott Salas, [facility]  You; Rx Prior Auth Team3 minutes ago (9:11 AM)    Please renew Repatha PA

## 2023-07-13 NOTE — Telephone Encounter (Signed)
PA request has been Submitted. New Encounter created for follow up. For additional info see Pharmacy Prior Auth telephone encounter from 07/13/23.

## 2023-07-13 NOTE — Telephone Encounter (Signed)
Spoke to patient, we will send an appeal on Monday Sept,27.

## 2023-07-16 ENCOUNTER — Other Ambulatory Visit (HOSPITAL_COMMUNITY): Payer: Self-pay

## 2023-07-16 NOTE — Addendum Note (Signed)
Addended by: Tylene Fantasia on: 07/16/2023 04:15 PM   Modules accepted: Orders

## 2023-07-16 NOTE — Telephone Encounter (Signed)
Will appeal Repatha Denial, last lipid lab is more than 6 months ago, will get updated lipid lab for [employer].  Lab ordered, patient made aware. Patient is out of town this week so will go far lab Monday morning 07/23/2023

## 2023-07-17 NOTE — Telephone Encounter (Signed)
Med currently needs to be appealed, waiting on labs, refill to be sent in after.

## 2023-07-23 ENCOUNTER — Ambulatory Visit: Payer: Medicare Other | Attending: Cardiology

## 2023-07-23 ENCOUNTER — Ambulatory Visit (INDEPENDENT_AMBULATORY_CARE_PROVIDER_SITE_OTHER): Payer: Medicare Other | Admitting: Otolaryngology

## 2023-07-23 ENCOUNTER — Encounter (INDEPENDENT_AMBULATORY_CARE_PROVIDER_SITE_OTHER): Payer: Self-pay | Admitting: Otolaryngology

## 2023-07-23 VITALS — Ht 70.0 in | Wt 190.0 lb

## 2023-07-23 DIAGNOSIS — H66011 Acute suppurative otitis media with spontaneous rupture of ear drum, right ear: Secondary | ICD-10-CM | POA: Insufficient documentation

## 2023-07-23 DIAGNOSIS — E7800 Pure hypercholesterolemia, unspecified: Secondary | ICD-10-CM

## 2023-07-23 DIAGNOSIS — H66014 Acute suppurative otitis media with spontaneous rupture of ear drum, recurrent, right ear: Secondary | ICD-10-CM | POA: Diagnosis not present

## 2023-07-24 ENCOUNTER — Telehealth: Payer: Self-pay | Admitting: Pharmacist

## 2023-07-24 ENCOUNTER — Other Ambulatory Visit (HOSPITAL_COMMUNITY): Payer: Self-pay

## 2023-07-24 LAB — LIPID PANEL
Chol/HDL Ratio: 3.2 {ratio} (ref 0.0–5.0)
Cholesterol, Total: 206 mg/dL — ABNORMAL HIGH (ref 100–199)
HDL: 64 mg/dL (ref >39)
LDL Chol Calc (NIH): 121 mg/dL — ABNORMAL HIGH (ref 0–99)
Triglycerides: 120 mg/dL (ref 0–149)
VLDL Cholesterol Cal: 21 mg/dL (ref 5–40)

## 2023-07-24 NOTE — Telephone Encounter (Signed)
Appeal has been sent

## 2023-07-24 NOTE — Progress Notes (Signed)
Patient will need Repatha again as he has statin myopathy.  On Repatha his lipids are very well-controlled.

## 2023-07-24 NOTE — Telephone Encounter (Signed)
Call to discuss recent Lipid lab.LDLc still above goal on once a month Repatha ( max tolerated dose).discussed LDLc goal with patient. Intolerance to multiple lipid lowering agent - scheduled appointment with PharmD on Oct 11 to discuss other available options.

## 2023-07-25 DIAGNOSIS — K080 Exfoliation of teeth due to systemic causes: Secondary | ICD-10-CM | POA: Diagnosis not present

## 2023-07-25 NOTE — Progress Notes (Signed)
Patient ID: Scott Salas, male   DOB: 04-03-1951, 72 y.o.   MRN: [id number]  CC: Right ear infection  HPI: The patient is a 72 year old male who presents today complaining of recurrent right ear infection.  According to the patient, he had an episode of right ear drainage 1 month ago.  He was treated with amoxicillin and ofloxacin eardrops without significant improvement in his symptoms.  Currently he is having purulent drainage from his right ear.  The patient was last seen in November 2023.  At that time, his right ventilating tube was in place and patent.  He was noted to have stable bilateral high-frequency sensorineural hearing loss.  Currently he denies any significant otalgia or vertigo.  Exam: General: Communicates without difficulty, well nourished, no acute distress. Head: Normocephalic, no evidence injury, no tenderness, facial buttresses intact without stepoff. Eyes: PERRL, EOMI. No scleral icterus, conjunctivae clear. Neuro: CN II exam reveals vision grossly intact.  No nystagmus at any point of gaze. Ears: Auricles well formed without lesions.  Purulent drainage is noted from the right ear canal.  Under the operating microscope, the right ear canal is debrided.  The right tube is in place and patent.  The left tympanic membrane is normal.  Nose: External evaluation reveals normal support and skin without lesions.  Dorsum is intact.  Anterior rhinoscopy reveals healthy pink mucosa over anterior aspect of inferior turbinates and intact septum.  No purulence noted. Oral:  Oral cavity and oropharynx are intact, symmetric, without erythema or edema.  Mucosa is moist without lesions. Neck: Full range of motion without pain.  There is no significant lymphadenopathy.  No masses palpable.  Thyroid bed within normal limits to palpation.  Parotid glands and submandibular glands equal bilaterally without mass.  Trachea is midline. Neuro:  CN 2-12 grossly intact. Gait normal. Vestibular: No nystagmus at any  point of gaze. The cerebellar examination is unremarkable.   Assessment: 1.  Acute right otitis media with purulent otorrhea. 2.  The right tube is in place and patent. 3.  The left tympanic membrane is intact and mobile.  Plan: 1.  The physical exam findings are reviewed with the patient. 2.  Otomicroscopy with debridement of the right ear canal. 3.  Ciprodex eardrops 4 drops right ear twice daily for 10 days. 4.  Dry ear precautions on the right side. 5.  The patient will return for reevaluation in 3 weeks.

## 2023-07-27 ENCOUNTER — Ambulatory Visit: Payer: Medicare Other | Attending: Cardiology | Admitting: Pharmacist Clinician (PhC)/ Clinical Pharmacy Specialist

## 2023-07-27 ENCOUNTER — Encounter: Payer: Self-pay | Admitting: Pharmacist Clinician (PhC)/ Clinical Pharmacy Specialist

## 2023-07-27 ENCOUNTER — Telehealth: Payer: Self-pay | Admitting: Pharmacist Clinician (PhC)/ Clinical Pharmacy Specialist

## 2023-07-27 DIAGNOSIS — E7800 Pure hypercholesterolemia, unspecified: Secondary | ICD-10-CM

## 2023-07-27 MED ORDER — REPATHA SURECLICK 140 MG/ML ~~LOC~~ SOAJ
140.0000 mg | SUBCUTANEOUS | Status: DC
Start: 2023-07-27 — End: 2023-08-22

## 2023-07-27 NOTE — Assessment & Plan Note (Addendum)
Assessment: Patient with CAD not at LDL goal of < 70 Most recent LDL 121 on 07/23/23 Discussed results of Coronary CT and what the calcium score represents. Not able to tolerate statins or ezetimibe secondary to myalgias Took Repatha for some time, but also had myalgias, was taking only every 30 days (140 mg dose), but still having issues.  Last dose 06/17/23 Reviewed options for lowering LDL cholesterol, including bempedoic acid and inclisiran.  Discussed mechanisms of action, dosing, side effects, potential decreases in LDL cholesterol and costs.  Reviewed potential options for patient assistance.   Discussed benefits of Repatha and re-challenging with this Plan: Patient agreeable to re-starting Repatha 140 mg q14d Was apparently denied insurance PA renewal, will have CPhT team look into this Gave samples x 2, will try q14d and repeat labs after second dose.

## 2023-07-27 NOTE — Patient Instructions (Signed)
Your Results:             Your most recent labs Goal  Total Cholesterol 206 < 200  Triglycerides 120 < 150  HDL (happy/good cholesterol) 64 > 40  LDL (lousy/bad cholesterol 121 < 70  1 Medication changes:  We will start the process to get Repath covered (again) by your insurance.  Once the prior authorization is complete, I will call/send a MyChart message to let you know and confirm pharmacy information.   You will take one injection every 2 weeks  Lab orders:  We want to repeat labs about a week after the second dose.   Patient Assistance:    We will sign you up for a [employer] Grant once your medication is approved by [employer].  I will call you with the ID number, then you will take this information to the pharmacy.  They will bill it after your insurance, bringing your copay to $0.  The grant will pay the first $2,500 in a one year period.    ID   BIN [id number]  PCN [id number]  GRP [id number]    Thank you for choosing [facility]

## 2023-07-27 NOTE — Telephone Encounter (Signed)
Can you figure out if he has an active PA for Repatha.  He's only been taking it once a month instead of every 2 weeks, and he tells me that the insurance wouldn't renew the PA.   If it's still active, just let me know (if you can), when it expires.  If there isn't one, can you try for it?  Challenge is that he has not taken Repatha since Sept 1, so his labs on Oct 7 are elevated.  Can you somehow indicate that he had a lapse of medication?    Let me know what magic you can do.  I will be out of the office next week, so Susa Day or Thayer Ohm may respond, but if it's nothing urgent, can wait until I get back on the 21st.    Thank you!

## 2023-07-27 NOTE — Progress Notes (Signed)
Office Visit    Patient Name: Scott Salas Date of Encounter: 07/27/2023  Primary Care Provider:  Emilio Aspen, MD Primary Cardiologist:  None  Chief Complaint    Hyperlipidemia   Significant Past Medical History   CAD 8/24 CAC =  827 - 81st percentile (2 years prior was 619); chronic stable angina, chronic DOE  HTN   preDM 4/24 A1c 5.9 (has been as high as 6.2)           Allergies  Allergen Reactions   Livalo [Pitavastatin] Other (See Comments)    Myalgia   Losartan Other (See Comments)    Leg cramps   Repatha [Evolocumab]     Myalgia    Zetia [Ezetimibe]     Myalgia    Crestor [Rosuvastatin]     Other reaction(s): muscle aches   Atorvastatin Other (See Comments)    myalgia    History of Present Illness    Scott Salas is a 72 y.o. male patient of Dr Jacinto Halim, in the office today to discuss options for cholesterol management.  He has been on Reaptha for about 2 years or so, but notes that it has always caused some muscle issues and he finally started using only every 30 days. LDL was down to 39, but when most recent labs were drawn last week, he had not had a dose in about 6 weeks.  LDL was back up to 121.  Insurance Carrier:  [employer]  LDL Cholesterol goal:  LDL < 70  Current Medications:   evolocumab 140 mg q30d (cannot tolerate q14d 2/2 myalgias)  Previously tried:  atorvastatin, rosuvastatin, pitavastatin - myalgias;  ezetimibe - myalgias  Family Hx: father had CHF, CABG at 54, stroke at time of CABG never came home;  mother died at 62 old age; brother died PE at 79; 2 kids, no known CVD  Social Hx: Tobacco: no Alcohol: couple of drinks per week  Diet:   mix of home and eating out, with more at home; more chicken than other proteins, does protein powder in the mornings with yogurt; could do better with vegetables, usually fresh; snacks on various foods   Exercise:  tennis 2-3 times per week, golf 2-3 times week, walks daily,  occasional bike  Accessory Clinical Findings   Lab Results  Component Value Date   CHOL 206 (H) 07/23/2023   HDL 64 07/23/2023   LDLCALC 121 (H) 07/23/2023   TRIG 120 07/23/2023   CHOLHDL 3.2 07/23/2023    Lipoprotein (a)  Date/Time Value Ref Range Status  07/12/2021 07:29 AM 38.1 <75.0 nmol/L Final    Comment:    Note:  Values greater than or equal to 75.0 nmol/L may        indicate an independent risk factor for CHD,        but must be evaluated with caution when applied        to non-Caucasian populations due to the        influence of genetic factors on Lp(a) across        ethnicities.     Lab Results  Component Value Date   ALT 23 07/16/2013   AST 23 07/16/2013   ALKPHOS 67 07/16/2013   BILITOT 0.4 07/16/2013   Lab Results  Component Value Date   CREATININE 0.87 05/04/2023   BUN 20 05/04/2023   NA 139 05/04/2023   K 4.6 05/04/2023   CL 100 05/04/2023   CO2 22 05/04/2023  No results found for: "HGBA1C"  Home Medications    Current Outpatient Medications  Medication Sig Dispense Refill   acetaminophen (TYLENOL) 500 MG tablet Take 500 mg by mouth every 6 (six) hours as needed.     amLODipine (NORVASC) 10 MG tablet Take 10 mg by mouth daily.     aspirin EC 81 MG tablet Take 1 tablet (81 mg total) by mouth daily. Swallow whole. 90 tablet 3   fexofenadine (ALLEGRA) 180 MG tablet      fluticasone (FLONASE) 50 MCG/ACT nasal spray Place 1 spray into both nostrils as needed.     ibuprofen (ADVIL) 200 MG tablet Take 200 mg by mouth every 8 (eight) hours as needed.     sildenafil (REVATIO) 20 MG tablet Take 20 mg by mouth daily as needed.     Evolocumab (REPATHA SURECLICK) 140 MG/ML SOAJ Inject 140 mg into the skin every 14 (fourteen) days. (Patient not taking: Reported on 07/27/2023)     No current facility-administered medications for this visit.     Assessment & Plan    Hypercholesteremia Assessment: Patient with CAD not at LDL goal of < 70 Most recent LDL  121 on 07/23/23 Discussed results of Coronary CT and what the calcium score represents. Not able to tolerate statins or ezetimibe secondary to myalgias Took Repatha for some time, but also had myalgias, was taking only every 30 days (140 mg dose), but still having issues.  Last dose 06/17/23 Reviewed options for lowering LDL cholesterol, including bempedoic acid and inclisiran.  Discussed mechanisms of action, dosing, side effects, potential decreases in LDL cholesterol and costs.  Reviewed potential options for patient assistance.   Discussed benefits of Repatha and re-challenging with this Plan: Patient agreeable to re-starting Repatha 140 mg q14d Was apparently denied insurance PA renewal, will have CPhT team look into this Gave samples x 2, will try q14d and repeat labs after second dose.    Phillips Hay, PharmD CPP [facility] [address]  [place], [place] [address] [phone]  07/27/2023, 11:57 AM

## 2023-07-31 ENCOUNTER — Other Ambulatory Visit (HOSPITAL_COMMUNITY): Payer: Self-pay

## 2023-07-31 NOTE — Telephone Encounter (Signed)
Appeal was approved.   Received notification from [facility] that Prior Authorization for REPATHA has been APPROVED from 07/31/23 to 07/29/24. Ran test claim, Copay is $45 (1 MONTH SUPPLY). This test claim was processed through [facility]- copay amounts may vary at other pharmacies due to pharmacy/plan contracts, or as the patient moves through the different stages of their insurance plan.

## 2023-08-02 NOTE — Telephone Encounter (Signed)
Appeal approved, call patient to inform, N/A LVM.

## 2023-08-10 DIAGNOSIS — G4733 Obstructive sleep apnea (adult) (pediatric): Secondary | ICD-10-CM | POA: Diagnosis not present

## 2023-08-13 ENCOUNTER — Ambulatory Visit (INDEPENDENT_AMBULATORY_CARE_PROVIDER_SITE_OTHER): Payer: Medicare Other | Admitting: Otolaryngology

## 2023-08-13 ENCOUNTER — Encounter (INDEPENDENT_AMBULATORY_CARE_PROVIDER_SITE_OTHER): Payer: Self-pay

## 2023-08-13 VITALS — Ht 70.0 in | Wt 190.0 lb

## 2023-08-13 DIAGNOSIS — Z09 Encounter for follow-up examination after completed treatment for conditions other than malignant neoplasm: Secondary | ICD-10-CM

## 2023-08-13 DIAGNOSIS — H66014 Acute suppurative otitis media with spontaneous rupture of ear drum, recurrent, right ear: Secondary | ICD-10-CM | POA: Diagnosis not present

## 2023-08-13 DIAGNOSIS — H6981 Other specified disorders of Eustachian tube, right ear: Secondary | ICD-10-CM | POA: Insufficient documentation

## 2023-08-13 DIAGNOSIS — H7201 Central perforation of tympanic membrane, right ear: Secondary | ICD-10-CM | POA: Insufficient documentation

## 2023-08-13 NOTE — Progress Notes (Signed)
Patient ID: Scott Salas, male   DOB: May 06, 1951, 72 y.o.   MRN: [id number]  Follow-up: Right ear drainage  HPI: The patient is a 72 year old male who returns today for his follow-up evaluation.  The patient was last seen 3 weeks ago.  At that time, he was noted purulent drainage from the right ear.  His right ventilating tube was in place and patent.  He was treated with debridement and Ciprodex eardrops.  The patient returns today reporting resolution of the right ear drainage.  His hearing has also improved.  Currently he denies any otalgia, otorrhea, or vertigo.  Exam: General: Communicates without difficulty, well nourished, no acute distress. Head: Normocephalic, no evidence injury, no tenderness, facial buttresses intact without stepoff. Face/sinus: No tenderness to palpation and percussion. Facial movement is normal and symmetric. Eyes: PERRL, EOMI. No scleral icterus, conjunctivae clear. Neuro: CN II exam reveals vision grossly intact.  No nystagmus at any point of gaze. Ears: Auricles well formed without lesions.  Ear canals are intact without mass or lesion.  No erythema or edema is appreciated.  The right ventilating tube is in place and patent.  The left tympanic membrane is intact and mobile.  Nose: External evaluation reveals normal support and skin without lesions.  Dorsum is intact.  Anterior rhinoscopy reveals congested mucosa over anterior aspect of inferior turbinates and intact septum.  No purulence noted. Oral:  Oral cavity and oropharynx are intact, symmetric, without erythema or edema.  Mucosa is moist without lesions. Neck: Full range of motion without pain.  There is no significant lymphadenopathy.  No masses palpable.  Thyroid bed within normal limits to palpation.  Parotid glands and submandibular glands equal bilaterally without mass.  Trachea is midline. Neuro:  CN 2-12 grossly intact.    Assessment: 1.  The patient's acute right otitis media has resolved.  No purulent  drainage is noted today. 2.  The right tube is in place and patent. 3.  The left tympanic membrane is intact and mobile.  Plan: 1.  The physical exam findings are reviewed with the patient. 2.  The patient is reassured that his acute infection has resolved. 3.  Continue dry ear precautions on the right side. 4.  The patient will return for reevaluation in 6 months.

## 2023-08-15 ENCOUNTER — Other Ambulatory Visit: Payer: Self-pay

## 2023-08-15 DIAGNOSIS — E7800 Pure hypercholesterolemia, unspecified: Secondary | ICD-10-CM

## 2023-08-16 ENCOUNTER — Other Ambulatory Visit: Payer: Self-pay

## 2023-08-16 ENCOUNTER — Other Ambulatory Visit: Payer: Self-pay | Admitting: Cardiology

## 2023-08-16 ENCOUNTER — Other Ambulatory Visit (HOSPITAL_COMMUNITY): Payer: Self-pay

## 2023-08-16 ENCOUNTER — Telehealth: Payer: Self-pay | Admitting: *Deleted

## 2023-08-16 DIAGNOSIS — I10 Essential (primary) hypertension: Secondary | ICD-10-CM

## 2023-08-16 LAB — LIPID PANEL
Chol/HDL Ratio: 1.9 ratio (ref 0.0–5.0)
Cholesterol, Total: 125 mg/dL (ref 100–199)
HDL: 65 mg/dL (ref >39)
LDL Chol Calc (NIH): 44 mg/dL (ref 0–99)
Triglycerides: 82 mg/dL (ref 0–149)
VLDL Cholesterol Cal: 16 mg/dL (ref 5–40)

## 2023-08-16 MED ORDER — AMLODIPINE BESYLATE 10 MG PO TABS
10.0000 mg | ORAL_TABLET | Freq: Every day | ORAL | 3 refills | Status: DC
  Filled 2023-08-16: qty 90, 90d supply, fill #0

## 2023-08-16 NOTE — Telephone Encounter (Signed)
In 10/19/22 Dr Nadara Eaton office note states:  2. . Essential hypertension Blood pressure has been elevated recently and he has increased the dose of the amlodipine from 5 mg to 10 mg without side effects of edema.  I refilled the prescription for 10 mg and also added hydrochlorothiazide 12.5 mg in the morning.  Will obtain BMP in 2 weeks.  He is not on an ACE inhibitor or ARB due to chronic hyperkalemia.  OK to refill 10 mg tablets take 1 po every day.

## 2023-08-16 NOTE — Telephone Encounter (Signed)
[facility] [phone] is requesting Rx for Amlodipine 10 mg. Per last epic note from 06/25/23 under recommendations. #2 states "12 legs cramps since back on Amlodipine", not sure if patient is to continue medication. Amlodipine is in patient's snap shot. Please advise. Thank you

## 2023-08-22 ENCOUNTER — Other Ambulatory Visit: Payer: Self-pay | Admitting: Pharmacist

## 2023-08-22 DIAGNOSIS — I25118 Atherosclerotic heart disease of native coronary artery with other forms of angina pectoris: Secondary | ICD-10-CM

## 2023-08-22 DIAGNOSIS — E7800 Pure hypercholesterolemia, unspecified: Secondary | ICD-10-CM

## 2023-08-22 MED ORDER — REPATHA SURECLICK 140 MG/ML ~~LOC~~ SOAJ: 140.0000 mg | SUBCUTANEOUS | 1 refills | Status: DC

## 2023-08-31 DIAGNOSIS — Z0189 Encounter for other specified special examinations: Secondary | ICD-10-CM | POA: Diagnosis not present

## 2023-09-03 ENCOUNTER — Other Ambulatory Visit: Payer: Self-pay | Admitting: Cardiology

## 2023-09-03 ENCOUNTER — Other Ambulatory Visit (HOSPITAL_COMMUNITY): Payer: Self-pay

## 2023-09-03 DIAGNOSIS — I25118 Atherosclerotic heart disease of native coronary artery with other forms of angina pectoris: Secondary | ICD-10-CM

## 2023-09-03 DIAGNOSIS — E7800 Pure hypercholesterolemia, unspecified: Secondary | ICD-10-CM

## 2023-09-03 MED ORDER — REPATHA SURECLICK 140 MG/ML ~~LOC~~ SOAJ
140.0000 mg | SUBCUTANEOUS | 3 refills | Status: DC
Start: 2023-09-03 — End: 2023-09-04
  Filled 2023-09-03: qty 6, 84d supply, fill #0

## 2023-09-03 NOTE — Telephone Encounter (Signed)
Routed to Pharm D

## 2023-09-03 NOTE — Telephone Encounter (Signed)
*  STAT* If patient is at the pharmacy, call can be transferred to refill team.   1. Which medications need to be refilled? (please list name of each medication and dose if known)   Evolocumab (REPATHA SURECLICK) 140 MG/ML SOAJ   2. Would you like to learn more about the convenience, safety, & potential cost savings by using the [facility]?   3. Are you open to using the [facility] (Type [facility]. ).  4. Which pharmacy/location (including street and city if local pharmacy) is medication to be sent to?  [facility] [place], [place] - [address]   5. Do they need a 30 day or 90 day supply?   30 day  Caller [facility]) stated patient still has some medication left but they will need a current prescription to reflect new instructions for this medication (Every 14 days).

## 2023-09-04 ENCOUNTER — Other Ambulatory Visit (HOSPITAL_COMMUNITY): Payer: Self-pay

## 2023-09-04 ENCOUNTER — Other Ambulatory Visit: Payer: Self-pay | Admitting: Pharmacist

## 2023-09-04 DIAGNOSIS — E7800 Pure hypercholesterolemia, unspecified: Secondary | ICD-10-CM

## 2023-09-04 DIAGNOSIS — I25118 Atherosclerotic heart disease of native coronary artery with other forms of angina pectoris: Secondary | ICD-10-CM

## 2023-09-04 MED ORDER — REPATHA SURECLICK 140 MG/ML ~~LOC~~ SOAJ: 140.0000 mg | SUBCUTANEOUS | 1 refills | Status: DC

## 2023-09-05 ENCOUNTER — Encounter: Payer: Self-pay | Admitting: Pharmacist Clinician (PhC)/ Clinical Pharmacy Specialist

## 2023-09-05 DIAGNOSIS — H35372 Puckering of macula, left eye: Secondary | ICD-10-CM | POA: Diagnosis not present

## 2023-09-05 DIAGNOSIS — H25013 Cortical age-related cataract, bilateral: Secondary | ICD-10-CM | POA: Diagnosis not present

## 2023-09-05 DIAGNOSIS — H2513 Age-related nuclear cataract, bilateral: Secondary | ICD-10-CM | POA: Diagnosis not present

## 2023-09-06 MED ORDER — REPATHA SURECLICK 140 MG/ML ~~LOC~~ SOAJ: 140.0000 mg | SUBCUTANEOUS | 3 refills | Status: DC

## 2023-09-10 DIAGNOSIS — G4733 Obstructive sleep apnea (adult) (pediatric): Secondary | ICD-10-CM | POA: Diagnosis not present

## 2023-09-18 DIAGNOSIS — M1712 Unilateral primary osteoarthritis, left knee: Secondary | ICD-10-CM | POA: Diagnosis not present

## 2023-09-18 DIAGNOSIS — G8918 Other acute postprocedural pain: Secondary | ICD-10-CM | POA: Diagnosis not present

## 2023-09-18 DIAGNOSIS — M25762 Osteophyte, left knee: Secondary | ICD-10-CM | POA: Diagnosis not present

## 2023-09-18 DIAGNOSIS — Z96652 Presence of left artificial knee joint: Secondary | ICD-10-CM | POA: Diagnosis not present

## 2023-09-20 DIAGNOSIS — M25562 Pain in left knee: Secondary | ICD-10-CM | POA: Diagnosis not present

## 2023-09-24 DIAGNOSIS — M25562 Pain in left knee: Secondary | ICD-10-CM | POA: Diagnosis not present

## 2023-09-26 DIAGNOSIS — M25562 Pain in left knee: Secondary | ICD-10-CM | POA: Diagnosis not present

## 2023-10-02 DIAGNOSIS — M25562 Pain in left knee: Secondary | ICD-10-CM | POA: Diagnosis not present

## 2023-10-04 DIAGNOSIS — M25562 Pain in left knee: Secondary | ICD-10-CM | POA: Diagnosis not present

## 2023-10-09 DIAGNOSIS — M25562 Pain in left knee: Secondary | ICD-10-CM | POA: Diagnosis not present

## 2023-10-10 DIAGNOSIS — G4733 Obstructive sleep apnea (adult) (pediatric): Secondary | ICD-10-CM | POA: Diagnosis not present

## 2023-10-11 DIAGNOSIS — M25562 Pain in left knee: Secondary | ICD-10-CM | POA: Diagnosis not present

## 2023-10-15 DIAGNOSIS — M25562 Pain in left knee: Secondary | ICD-10-CM | POA: Diagnosis not present

## 2023-10-19 DIAGNOSIS — M25562 Pain in left knee: Secondary | ICD-10-CM | POA: Diagnosis not present

## 2023-10-23 DIAGNOSIS — M25562 Pain in left knee: Secondary | ICD-10-CM | POA: Diagnosis not present

## 2023-10-25 DIAGNOSIS — M25562 Pain in left knee: Secondary | ICD-10-CM | POA: Diagnosis not present

## 2023-10-29 DIAGNOSIS — M25562 Pain in left knee: Secondary | ICD-10-CM | POA: Diagnosis not present

## 2023-10-30 DIAGNOSIS — Z5189 Encounter for other specified aftercare: Secondary | ICD-10-CM | POA: Diagnosis not present

## 2023-11-01 DIAGNOSIS — M25562 Pain in left knee: Secondary | ICD-10-CM | POA: Diagnosis not present

## 2023-11-05 DIAGNOSIS — M25562 Pain in left knee: Secondary | ICD-10-CM | POA: Diagnosis not present

## 2023-11-08 DIAGNOSIS — M25562 Pain in left knee: Secondary | ICD-10-CM | POA: Diagnosis not present

## 2023-11-13 DIAGNOSIS — M25562 Pain in left knee: Secondary | ICD-10-CM | POA: Diagnosis not present

## 2023-11-15 DIAGNOSIS — M25562 Pain in left knee: Secondary | ICD-10-CM | POA: Diagnosis not present

## 2023-11-20 DIAGNOSIS — M25562 Pain in left knee: Secondary | ICD-10-CM | POA: Diagnosis not present

## 2023-11-22 DIAGNOSIS — M25562 Pain in left knee: Secondary | ICD-10-CM | POA: Diagnosis not present

## 2023-11-27 DIAGNOSIS — M25562 Pain in left knee: Secondary | ICD-10-CM | POA: Diagnosis not present

## 2023-11-30 ENCOUNTER — Telehealth: Payer: Self-pay | Admitting: Cardiology

## 2023-11-30 NOTE — Telephone Encounter (Signed)
Left message to call office

## 2023-11-30 NOTE — Telephone Encounter (Signed)
I spoke with patient.  He reports for several weeks he has been having dizziness off and on.  Thinks it started after knee surgery.  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  Had to sit down as he felt he might pass out.  He is feeling fine now.  Was not doing a great deal of exertion at the time.  Patient reports he gets up in the morning and sits on the side of the bed.  He feels dizzy when he stands and will also feel dizzy at times when walking through the house.  Can occur sometimes several times during the day.  He is staying hydrated but drinking more tea than water.  I asked him to try and decrease tea intake and increase water intake.  Patient also reports he had a tube put in his ear a couple of years ago.   Has not had lab work checked since knee surgery.  Reports BP has been running higher than normal at times.  No chest pain, shortness of breath, does not feel heart racing.   I asked patient to follow up with PCP for dizziness and possible ear issue.  Patient will send in BP and heart rate readings.   I told patient if Dr Jacinto Halim had any new recommendations we would call him back

## 2023-11-30 NOTE — Telephone Encounter (Signed)
  Per MyChart scheduling message:   Initial complaint: I have been having dizzy spells that concerns me.   STAT if patient feels like he/she is going to faint   1. Are you feeling dizzy, lightheaded, or faint right now?     2. Have you passed out?   (If yes move to .SYNCOPECHMG)   3. Do you have any other symptoms?    4. Have you checked your HR and BP (record if available)?    Not this very moment but 2 episodes between 9;30 & 10:30 this morning  that i felt like I almost passed out . Lasted about 10 seconds each while was having a personal training sessions. BP 30 minutes ago was 137/90 with a pulse of 77. I seem to have a  spell about every morning when I get out of bed also.     Some more history. I had knee replacements 10 weeks ago and my BP ran about 140*145/90 most of the time. I asked my ortho physician if it  should be a concern and he said the meds and the surgery would raise the BP.   As often as it seems to be occurring I thought I might check in and see what your   thoughts would be.   Thanks

## 2023-12-04 ENCOUNTER — Encounter: Payer: Self-pay | Admitting: Cardiology

## 2023-12-04 DIAGNOSIS — H8111 Benign paroxysmal vertigo, right ear: Secondary | ICD-10-CM | POA: Diagnosis not present

## 2023-12-04 NOTE — Telephone Encounter (Signed)
 Patient sent the following message to the scheduling pool  Scott Salas "Phil"  P [facility] [address] Scheduling (supporting Yates Decamp, MD)Yesterday (4:45 AM)    Could you forward this to [name]? Thanks Here are some BP readings over the past month. 146/89   pulse 71 137/90                78 134/84                 78 147/91                  79 137/85                  76   I will schedule an appt with my GP and start there with the dizziness. Thanks for your help Friday.   Scott Salas

## 2023-12-04 NOTE — Telephone Encounter (Signed)
 Agree with the assessment, he does not have any significant cardiac issues, poorly on 1 antihypertensive medication., I reviewed his chart.  His symptoms persist we could certainly see him back but I will start with seeing his PCP. MyChart message sent today by me.

## 2023-12-10 ENCOUNTER — Ambulatory Visit: Payer: Medicare Other | Admitting: Physical Therapy

## 2023-12-10 DIAGNOSIS — R509 Fever, unspecified: Secondary | ICD-10-CM | POA: Diagnosis not present

## 2023-12-10 DIAGNOSIS — J101 Influenza due to other identified influenza virus with other respiratory manifestations: Secondary | ICD-10-CM | POA: Diagnosis not present

## 2023-12-10 DIAGNOSIS — R051 Acute cough: Secondary | ICD-10-CM | POA: Diagnosis not present

## 2023-12-10 DIAGNOSIS — R110 Nausea: Secondary | ICD-10-CM | POA: Diagnosis not present

## 2023-12-19 ENCOUNTER — Ambulatory Visit: Attending: Physician Assistant

## 2023-12-19 DIAGNOSIS — R2681 Unsteadiness on feet: Secondary | ICD-10-CM | POA: Insufficient documentation

## 2023-12-19 DIAGNOSIS — R42 Dizziness and giddiness: Secondary | ICD-10-CM | POA: Diagnosis not present

## 2023-12-19 NOTE — Therapy (Signed)
 OUTPATIENT PHYSICAL THERAPY VESTIBULAR EVALUATION     Patient Name: Scott Salas MRN: [id number] DOB:1951/08/10, 73 y.o., male Today's Date: 12/19/2023  END OF SESSION:  PT End of Session - 12/19/23 0750     Visit Number 1    Number of Visits 1    Authorization Type [employer] medicare    Progress Note Due on Visit 10    PT Start Time 0800    PT Stop Time 0827    PT Time Calculation (min) 27 min    Activity Tolerance Patient tolerated treatment well    Behavior During Therapy [facility] for tasks assessed/performed             Past Medical History:  Diagnosis Date   Arthritis    Chest pain    Essential hypertension 04/23/2023   Hyperlipidemia    Past Surgical History:  Procedure Laterality Date   acl left knee     left knee   KNEE ARTHROSCOPY     right   TONSILLECTOMY     as child   TOTAL KNEE ARTHROPLASTY Right 07/21/2013   Procedure: RIGHT TOTAL KNEE ARTHROPLASTY;  Surgeon: Loanne Drilling, MD;  Location: [facility];  Service: Orthopedics;  Laterality: Right;   Patient Active Problem List   Diagnosis Date Noted   Central perforation of tympanic membrane of right ear 08/13/2023   Other specified disorders of eustachian tube, right ear 08/13/2023   Acute suppurative otitis media of right ear with spontaneous rupture of tympanic membrane 07/23/2023   Elevated coronary artery calcium score 03/03/2020: Total Agatston score 619, MESA database percentile 93. 04/23/2023   Essential hypertension 04/23/2023   Aortic root dilatation (HCC) 04/23/2023   Hypercholesteremia 04/23/2023   Hyponatremia 08/01/2013   OA (osteoarthritis) of knee 07/21/2013    PCP: Eleanora Neighbor, MD REFERRING PROVIDER: Blima Singer, PA  REFERRING DIAG: H81.11 (ICD-10-CM) - Benign paroxysmal vertigo, right ear   THERAPY DIAG:  Unsteadiness on feet - Plan: PT plan of care cert/re-cert  Dizziness and giddiness - Plan: PT plan of care cert/re-cert  ONSET DATE: 12/06/23 referral  Rationale for  Evaluation and Treatment: Rehabilitation  SUBJECTIVE:   SUBJECTIVE STATEMENT: Patient arrives to clinic alone, no AD. Reports intermittent room spinning, mainly in the morning when he first sits up. More frequent LOB to the L. This has been going on for several weeks/1 month. He has frequent ear infections and has a tube in his R ear for the past 2-3 years.  Pt accompanied by: self  PERTINENT HISTORY: L TKA 12/24, HTN, HLD  PAIN:  Are you having pain? No  PRECAUTIONS: Fall   WEIGHT BEARING RESTRICTIONS: No  FALLS: Has patient fallen in last 6 months? No  LIVING ENVIRONMENT: Lives with: lives with their family Lives in: House/apartment Stairs: Yes: Internal: flight steps; on right going up and External: 5 steps; on right going up Has following equipment at home: Single point cane, Walker - 2 wheeled, Marine scientist  PLOF: Independent, driving, semi-retired real estate  PATIENT GOALS: "stop the dizziness"  OBJECTIVE:  Note: Objective measures were completed at Evaluation unless otherwise noted.  DIAGNOSTIC FINDINGS: none  COGNITION: Overall cognitive status: Within functional limits for tasks assessed   SENSATION: WFL  POSTURE:  No Significant postural limitations  Cervical ROM:   WFL, pain free  STRENGTH: WFL   BED MOBILITY:  Independent, dizziness with rolling L and sitting up  GAIT: Gait pattern: WFL   VESTIBULAR ASSESSMENT:  GENERAL OBSERVATION: NAD, no AD  SYMPTOM BEHAVIOR:  Subjective history: see above  Non-Vestibular symptoms:  none  Type of dizziness: Spinning/Vertigo  Frequency: several times a day  Duration: 5-10s  Aggravating factors: Induced by position change: lying supine, rolling to the left, and supine to sit, Induced by motion: occur when walking, turning body quickly, and turning head quickly, and Worse in the morning  Relieving factors:  avoid certain movements   Progression of symptoms: unchanged  OCULOMOTOR EXAM:  Ocular  Alignment: normal  Ocular ROM: No Limitations  Spontaneous Nystagmus: absent  Gaze-Induced Nystagmus: absent  Smooth Pursuits: intact  Saccades: hypometric/undershoots to the L  Convergence/Divergence: ~8 cm   VESTIBULAR - OCULAR REFLEX:   Slow VOR: Normal  VOR Cancellation: Normal  Head-Impulse Test: HIT Right: negative HIT Left: negative  Dynamic Visual Acuity: TBA   POSITIONAL TESTING: Right Dix-Hallpike: no nystagmus Left Dix-Hallpike: no nystagmus Right Roll Test: no nystagmus Left Roll Test: no nystagmus *all positions tested 3x and DH with slight trendelenburg as well  MOTION SENSITIVITY:  Motion Sensitivity Quotient Intensity: 0 = none, 1 = Lightheaded, 2 = Mild, 3 = Moderate, 4 = Severe, 5 = Vomiting  Intensity  1. Sitting to supine 0  2. Supine to L side   3. Supine to R side   4. Supine to sitting   5. L Hallpike-Dix 0  6. Up from L  1  7. R Hallpike-Dix 0  8. Up from R  1  9. Sitting, head tipped to L knee   10. Head up from L knee   11. Sitting, head tipped to R knee   12. Head up from R knee   13. Sitting head turns x5   14.Sitting head nods x5   15. In stance, 180 turn to L    16. In stance, 180 turn to R                                                                                            TREATMENT Self care/home management: -pathophys of BPPV, ddx, red/yellow flags, f/u with ENT vs cardiologist   PATIENT EDUCATION: Education details: see above, PT POC Person educated: Patient Education method: Explanation Education comprehension: verbalized understanding  HOME EXERCISE PROGRAM:  GOALS: Not indicated as patient does not require skilled PT services at this time ASSESSMENT:  CLINICAL IMPRESSION: Patient is a 73 y.o. male who was seen today for physical therapy evaluation and treatment for dizziness. He describes BPPV with spinning dizziness with positional changes, such as rolling over in bed, sit <> supine, but on eval today- there was  no evidence of BPPV. He presented with a normal vestibular exam, except for very slight motion sensitivity upon returning upright from dix hallpike. Given normal vestibular exam, he does not require skilled PT services at this time and should f/u with PCP, cards or ENT.     CLINICAL DECISION MAKING: Stable/uncomplicated  EVALUATION COMPLEXITY: Low   PLAN:  PT FREQUENCY: one time visit  PT DURATION: other: 1x visit     Westley Foots, PT Westley Foots, PT, DPT, CBIS  12/19/2023, 8:45 AM

## 2023-12-27 DIAGNOSIS — K080 Exfoliation of teeth due to systemic causes: Secondary | ICD-10-CM | POA: Diagnosis not present

## 2024-01-02 ENCOUNTER — Telehealth (INDEPENDENT_AMBULATORY_CARE_PROVIDER_SITE_OTHER): Payer: Self-pay | Admitting: Otolaryngology

## 2024-01-02 DIAGNOSIS — G4733 Obstructive sleep apnea (adult) (pediatric): Secondary | ICD-10-CM | POA: Diagnosis not present

## 2024-01-02 NOTE — Telephone Encounter (Signed)
 Left voicemail to reschedule follow up. Dr. Suszanne Conners out of town 4/16

## 2024-01-29 ENCOUNTER — Ambulatory Visit (INDEPENDENT_AMBULATORY_CARE_PROVIDER_SITE_OTHER): Payer: Medicare Other

## 2024-02-02 DIAGNOSIS — G4733 Obstructive sleep apnea (adult) (pediatric): Secondary | ICD-10-CM | POA: Diagnosis not present

## 2024-02-12 DIAGNOSIS — G629 Polyneuropathy, unspecified: Secondary | ICD-10-CM | POA: Diagnosis not present

## 2024-02-12 DIAGNOSIS — Z79899 Other long term (current) drug therapy: Secondary | ICD-10-CM | POA: Diagnosis not present

## 2024-02-12 DIAGNOSIS — Z23 Encounter for immunization: Secondary | ICD-10-CM | POA: Diagnosis not present

## 2024-02-12 DIAGNOSIS — Z0000 Encounter for general adult medical examination without abnormal findings: Secondary | ICD-10-CM | POA: Diagnosis not present

## 2024-02-12 DIAGNOSIS — Z1331 Encounter for screening for depression: Secondary | ICD-10-CM | POA: Diagnosis not present

## 2024-02-12 DIAGNOSIS — E7800 Pure hypercholesterolemia, unspecified: Secondary | ICD-10-CM | POA: Diagnosis not present

## 2024-02-12 DIAGNOSIS — R7303 Prediabetes: Secondary | ICD-10-CM | POA: Diagnosis not present

## 2024-02-12 DIAGNOSIS — I10 Essential (primary) hypertension: Secondary | ICD-10-CM | POA: Diagnosis not present

## 2024-02-12 DIAGNOSIS — Z125 Encounter for screening for malignant neoplasm of prostate: Secondary | ICD-10-CM | POA: Diagnosis not present

## 2024-02-18 ENCOUNTER — Other Ambulatory Visit (HOSPITAL_COMMUNITY): Payer: Self-pay

## 2024-02-18 MED ORDER — PRALUENT 75 MG/ML ~~LOC~~ SOAJ
75.0000 mg | SUBCUTANEOUS | 11 refills | Status: DC
  Filled 2024-02-18: qty 2, 28d supply, fill #0

## 2024-03-03 DIAGNOSIS — G4733 Obstructive sleep apnea (adult) (pediatric): Secondary | ICD-10-CM | POA: Diagnosis not present

## 2024-03-14 ENCOUNTER — Ambulatory Visit (INDEPENDENT_AMBULATORY_CARE_PROVIDER_SITE_OTHER): Admitting: Otolaryngology

## 2024-03-14 ENCOUNTER — Encounter (INDEPENDENT_AMBULATORY_CARE_PROVIDER_SITE_OTHER): Payer: Self-pay | Admitting: Otolaryngology

## 2024-03-14 VITALS — BP 144/83 | HR 61

## 2024-03-14 DIAGNOSIS — H6981 Other specified disorders of Eustachian tube, right ear: Secondary | ICD-10-CM

## 2024-03-14 DIAGNOSIS — H903 Sensorineural hearing loss, bilateral: Secondary | ICD-10-CM

## 2024-03-14 DIAGNOSIS — R42 Dizziness and giddiness: Secondary | ICD-10-CM | POA: Diagnosis not present

## 2024-03-14 DIAGNOSIS — Z09 Encounter for follow-up examination after completed treatment for conditions other than malignant neoplasm: Secondary | ICD-10-CM

## 2024-03-14 DIAGNOSIS — Z8669 Personal history of other diseases of the nervous system and sense organs: Secondary | ICD-10-CM

## 2024-03-14 DIAGNOSIS — H7201 Central perforation of tympanic membrane, right ear: Secondary | ICD-10-CM

## 2024-03-14 DIAGNOSIS — I10 Essential (primary) hypertension: Secondary | ICD-10-CM | POA: Diagnosis not present

## 2024-03-14 DIAGNOSIS — Z9629 Presence of other otological and audiological implants: Secondary | ICD-10-CM

## 2024-03-14 DIAGNOSIS — I2510 Atherosclerotic heart disease of native coronary artery without angina pectoris: Secondary | ICD-10-CM | POA: Diagnosis not present

## 2024-03-15 DIAGNOSIS — I10 Essential (primary) hypertension: Secondary | ICD-10-CM | POA: Diagnosis not present

## 2024-03-15 DIAGNOSIS — E7800 Pure hypercholesterolemia, unspecified: Secondary | ICD-10-CM | POA: Diagnosis not present

## 2024-03-15 DIAGNOSIS — I2510 Atherosclerotic heart disease of native coronary artery without angina pectoris: Secondary | ICD-10-CM | POA: Diagnosis not present

## 2024-03-15 DIAGNOSIS — E663 Overweight: Secondary | ICD-10-CM | POA: Diagnosis not present

## 2024-03-16 DIAGNOSIS — H903 Sensorineural hearing loss, bilateral: Secondary | ICD-10-CM | POA: Insufficient documentation

## 2024-03-16 DIAGNOSIS — R42 Dizziness and giddiness: Secondary | ICD-10-CM | POA: Insufficient documentation

## 2024-03-16 NOTE — Progress Notes (Signed)
 Patient ID: Scott Salas, male   DOB: 09/18/1951, 73 y.o.   MRN: [id number]  New complaint: Recurrent dizziness Follow-up: Recurrent right ear infection, right ventilating tube placement  HPI: The patient is a 73 year old male who returns today with a new complaint of recurrent dizziness.  The patient was previously seen for recurrent right ear infections and bilateral high-frequency sensorineural hearing loss.  He previously underwent right myringotomy and tube placement.  At his last visit in October 2024, his acute right otitis media had resolved.  The patient returns today reporting no more ear infection.  He is complaining of recurrent dizziness over the past year.  He describes the dizziness as an off-balance and lightheaded sensation.  Each episode lasts for seconds to minutes.  He is currently seeing a physical therapist for balance rehab.  Currently he denies any otalgia, otorrhea, recent change in his hearing, or vertigo.  Exam: General: Communicates without difficulty, well nourished, no acute distress. Head: Normocephalic, no evidence injury, no tenderness, facial buttresses intact without stepoff. Face/sinus: No tenderness to palpation and percussion. Facial movement is normal and symmetric. Eyes: PERRL, EOMI. No scleral icterus, conjunctivae clear. Neuro: CN II exam reveals vision grossly intact.  No nystagmus at any point of gaze. Ears: Auricles well formed without lesions. The right ventilating tube is in place and patent.  The left tympanic membrane is intact and mobile.  Nose: External evaluation reveals normal support and skin without lesions.  Dorsum is intact.  Anterior rhinoscopy reveals congested mucosa over anterior aspect of inferior turbinates and intact septum.  No purulence noted. Oral:  Oral cavity and oropharynx are intact, symmetric, without erythema or edema.  Mucosa is moist without lesions. Neck: Full range of motion without pain.  There is no significant lymphadenopathy.   No masses palpable.  Thyroid  bed within normal limits to palpation.  Parotid glands and submandibular glands equal bilaterally without mass.  Trachea is midline. Neuro:  CN 2-12 grossly intact. Vestibular: No nystagmus at any point of gaze. Dix Hallpike negative. Vestibular: There is no nystagmus with pneumatic pressure on either tympanic membrane or Valsalva. The cerebellar examination is unremarkable.    Assessment: 1.  The patient's right ventilating tube is in place and patent.  No drainage is noted. 2.  The left tympanic membrane and middle ear space are normal. 3.  Recurrent dizziness of unknown etiology. The possible differential diagnoses include transient BPPV, vestibular migraine, Meniere's disease, peripheral vestibular dysfunction, or other central/systemic causes.   4.  Subjectively stable bilateral high-frequency sensorineural hearing loss.  Plan: 1.  The physical exam findings are reviewed with the patient. 2.  Continue dry ear precaution on the right side. 3.  The pathophysiology of vestibular dysfunction and dizziness are discussed extensively with the patient. The possible differential diagnoses are reviewed. Questions are invited and answered.   4.  Continue with his current physical therapy. 5.  If the patient continues to be symptomatic, he may benefit from vestibular neurodiagnostic testing at a tertiary care center to evaluate for possible vestibular dysfunction.  6.  The patient will return for reevaluation in 6 months, sooner if needed.

## 2024-03-26 DIAGNOSIS — K080 Exfoliation of teeth due to systemic causes: Secondary | ICD-10-CM | POA: Diagnosis not present

## 2024-04-02 DIAGNOSIS — G4733 Obstructive sleep apnea (adult) (pediatric): Secondary | ICD-10-CM | POA: Diagnosis not present

## 2024-04-08 DIAGNOSIS — Z79899 Other long term (current) drug therapy: Secondary | ICD-10-CM | POA: Diagnosis not present

## 2024-04-08 DIAGNOSIS — I2510 Atherosclerotic heart disease of native coronary artery without angina pectoris: Secondary | ICD-10-CM | POA: Diagnosis not present

## 2024-04-12 DIAGNOSIS — I2510 Atherosclerotic heart disease of native coronary artery without angina pectoris: Secondary | ICD-10-CM | POA: Diagnosis not present

## 2024-04-12 DIAGNOSIS — I10 Essential (primary) hypertension: Secondary | ICD-10-CM | POA: Diagnosis not present

## 2024-04-14 ENCOUNTER — Other Ambulatory Visit: Payer: Self-pay | Admitting: Cardiology

## 2024-04-14 DIAGNOSIS — I10 Essential (primary) hypertension: Secondary | ICD-10-CM | POA: Diagnosis not present

## 2024-04-14 DIAGNOSIS — I2510 Atherosclerotic heart disease of native coronary artery without angina pectoris: Secondary | ICD-10-CM | POA: Diagnosis not present

## 2024-05-08 DIAGNOSIS — I2510 Atherosclerotic heart disease of native coronary artery without angina pectoris: Secondary | ICD-10-CM | POA: Diagnosis not present

## 2024-05-08 DIAGNOSIS — I340 Nonrheumatic mitral (valve) insufficiency: Secondary | ICD-10-CM | POA: Diagnosis not present

## 2024-05-08 DIAGNOSIS — R42 Dizziness and giddiness: Secondary | ICD-10-CM | POA: Diagnosis not present

## 2024-05-08 DIAGNOSIS — I10 Essential (primary) hypertension: Secondary | ICD-10-CM | POA: Diagnosis not present

## 2024-05-12 DIAGNOSIS — I2510 Atherosclerotic heart disease of native coronary artery without angina pectoris: Secondary | ICD-10-CM | POA: Diagnosis not present

## 2024-05-12 DIAGNOSIS — I10 Essential (primary) hypertension: Secondary | ICD-10-CM | POA: Diagnosis not present

## 2024-05-15 DIAGNOSIS — E7800 Pure hypercholesterolemia, unspecified: Secondary | ICD-10-CM | POA: Diagnosis not present

## 2024-05-15 DIAGNOSIS — I2510 Atherosclerotic heart disease of native coronary artery without angina pectoris: Secondary | ICD-10-CM | POA: Diagnosis not present

## 2024-05-15 DIAGNOSIS — I10 Essential (primary) hypertension: Secondary | ICD-10-CM | POA: Diagnosis not present

## 2024-05-15 DIAGNOSIS — E663 Overweight: Secondary | ICD-10-CM | POA: Diagnosis not present

## 2024-05-21 ENCOUNTER — Telehealth: Payer: Self-pay | Admitting: *Deleted

## 2024-05-21 NOTE — Telephone Encounter (Signed)
 Pt called back and I scheduled his 1 year f/u

## 2024-05-21 NOTE — Telephone Encounter (Signed)
 Call placed to patient to schedule yearly follow up appointment.  Left message to call office.

## 2024-05-30 DIAGNOSIS — I340 Nonrheumatic mitral (valve) insufficiency: Secondary | ICD-10-CM | POA: Diagnosis not present

## 2024-06-11 DIAGNOSIS — I2510 Atherosclerotic heart disease of native coronary artery without angina pectoris: Secondary | ICD-10-CM | POA: Diagnosis not present

## 2024-06-11 DIAGNOSIS — I10 Essential (primary) hypertension: Secondary | ICD-10-CM | POA: Diagnosis not present

## 2024-06-12 ENCOUNTER — Ambulatory Visit: Payer: Medicare Other | Admitting: Cardiology

## 2024-06-15 DIAGNOSIS — I10 Essential (primary) hypertension: Secondary | ICD-10-CM | POA: Diagnosis not present

## 2024-06-15 DIAGNOSIS — I2510 Atherosclerotic heart disease of native coronary artery without angina pectoris: Secondary | ICD-10-CM | POA: Diagnosis not present

## 2024-06-15 DIAGNOSIS — E663 Overweight: Secondary | ICD-10-CM | POA: Diagnosis not present

## 2024-06-15 DIAGNOSIS — E7800 Pure hypercholesterolemia, unspecified: Secondary | ICD-10-CM | POA: Diagnosis not present

## 2024-07-11 DIAGNOSIS — I10 Essential (primary) hypertension: Secondary | ICD-10-CM | POA: Diagnosis not present

## 2024-07-11 DIAGNOSIS — I2510 Atherosclerotic heart disease of native coronary artery without angina pectoris: Secondary | ICD-10-CM | POA: Diagnosis not present

## 2024-07-15 DIAGNOSIS — E663 Overweight: Secondary | ICD-10-CM | POA: Diagnosis not present

## 2024-07-15 DIAGNOSIS — E7800 Pure hypercholesterolemia, unspecified: Secondary | ICD-10-CM | POA: Diagnosis not present

## 2024-07-15 DIAGNOSIS — I2510 Atherosclerotic heart disease of native coronary artery without angina pectoris: Secondary | ICD-10-CM | POA: Diagnosis not present

## 2024-07-15 DIAGNOSIS — I10 Essential (primary) hypertension: Secondary | ICD-10-CM | POA: Diagnosis not present

## 2024-07-24 ENCOUNTER — Ambulatory Visit: Attending: Cardiology | Admitting: Cardiology

## 2024-07-24 ENCOUNTER — Encounter: Payer: Self-pay | Admitting: Cardiology

## 2024-07-24 VITALS — BP 140/76 | HR 54 | Ht 70.0 in | Wt 197.0 lb

## 2024-07-24 DIAGNOSIS — I2510 Atherosclerotic heart disease of native coronary artery without angina pectoris: Secondary | ICD-10-CM

## 2024-07-24 DIAGNOSIS — I7121 Aneurysm of the ascending aorta, without rupture: Secondary | ICD-10-CM

## 2024-07-24 DIAGNOSIS — I10 Essential (primary) hypertension: Secondary | ICD-10-CM

## 2024-07-24 DIAGNOSIS — T466X5D Adverse effect of antihyperlipidemic and antiarteriosclerotic drugs, subsequent encounter: Secondary | ICD-10-CM

## 2024-07-24 DIAGNOSIS — G720 Drug-induced myopathy: Secondary | ICD-10-CM

## 2024-07-24 DIAGNOSIS — E7800 Pure hypercholesterolemia, unspecified: Secondary | ICD-10-CM

## 2024-07-24 MED ORDER — VALSARTAN 40 MG PO TABS
40.0000 mg | ORAL_TABLET | Freq: Every evening | ORAL | 3 refills | Status: AC
Start: 2024-07-24 — End: ?

## 2024-07-24 MED ORDER — ATORVASTATIN CALCIUM 10 MG PO TABS
10.0000 mg | ORAL_TABLET | ORAL | 3 refills | Status: AC
Start: 2024-07-24 — End: 2024-10-22

## 2024-07-24 NOTE — Patient Instructions (Signed)
 Medication Instructions:  Your physician recommends that you continue on your current medications as directed. Please refer to the Current Medication list given to you today.  *If you need a refill on your cardiac medications before your next appointment, please call your pharmacy*  Lab Work: None.  If you have labs (blood work) drawn today and your tests are completely normal, you will receive your results only by: MyChart Message (if you have MyChart) OR A paper copy in the mail If you have any lab test that is abnormal or we need to change your treatment, we will call you to review the results.  Testing/Procedures: None.  Follow-Up: At [facility], you and your health needs are our priority.  As part of our continuing mission to provide you with exceptional heart care, our providers are all part of one team.  This team includes your primary Cardiologist (physician) and Advanced Practice Providers or APPs (Physician Assistants and Nurse Practitioners) who all work together to provide you with the care you need, when you need it.  Your next appointment:   1 year(s)  Provider:   Dr. Ganji

## 2024-07-24 NOTE — Progress Notes (Signed)
 Cardiology Office Note:  .   Date:  07/24/2024  ID:  Scott Salas, DOB [birth year], MRN [id number] PCP: Charlott Dorn LABOR, MD  [facility] Providers Cardiologist:  None   History of Present Illness: .   Scott Salas is a 73 y.o. Caucasian male with chronic stable angina pectoris, chronic dyspnea on exertion, symptoms completely resolved on medical therapy,  hypertension, hyperlipidemia and hyperglycemia, OSA on CPAP, coronary calcium  score in the 91st percentile, 43 mm ascending aorta during coronary calcium  score evaluation CT in 2021. Patient has history of statin induced myopathy.  He was prescribed Leqvio but he did not follow through, he restarted Repatha  but discontinued this as he again had severe myalgias.  He stopped all his medications states that he feels well.  He was on amlodipine  which he also stopped due to dizziness and states that his blood pressure at home has been around 130/70 mmHg. This is a annual visit. Remains asymptomatic and stated he  Cardiac Studies relevent.    Lexiscan  nuclear stress test 10/05/2019: Normal LV systolic function, no evidence of ischemia, LVEF 80%, low risk.  Echocardiogram 11/02/2022: Normal LVEF at 60 to 65%.  No wall motion abnormality. Aortic valve sclerosis.  Coronary calcium  score 05/21/2023: Total score 827, [place] database 81 percentile. Dilated aorta at 44 mm.  No significant change in size from 03/03/2021.    Discussed the use of AI scribe software for clinical note transcription with the patient, who gave verbal consent to proceed.  History of Present Illness Scott Salas is a 73 year old male with hyperlipidemia and hypertension who presents with dizziness and medication management concerns.  He discontinued Repatha  and Praluent  on May 31, 2024, due to severe leg discomfort and reports improvement since stopping these medications. He previously tried Lequio but did not continue it.  He is not taking blood  pressure medications due to brief dizzy spells during activities like playing tennis, lasting two to three seconds. His average blood pressure last month was 127/78 mmHg, with occasional readings as low as 110/60 mmHg. He is currently only taking a baby aspirin  and has not experienced dizziness recently.  An echocardiogram on May 30, 2024, showed normal left ventricular systolic function, mild aortic sclerosis, and a normal-sized ascending aorta.  He wants to continue playing tennis and is open to trying new medication regimens to manage his symptoms.   Labs   Lipoprotein (a)  Date/Time Value Ref Range Status  07/12/2021 07:29 AM 38.1 <75.0 nmol/L Final    Comment:    Note:  Values greater than or equal to 75.0 nmol/L may        indicate an independent risk factor for CHD,        but must be evaluated with caution when applied        to non-Caucasian populations due to the        influence of genetic factors on Lp(a) across        ethnicities.     Care everywhere/Faxed External Labs:  Labs 04/11/2024:  Total cholesterol 116, triglycerides 127, HDL 59, LDL 36.  TSH normal at 1.65.  A1c 6.1%.  Serum glucose 95 mg, BUN 19, creatinine 0.82, eGFR 94 mL, potassium 4.3, LFTs normal.  Hb 15.6/HCT 45.0, platelets 280, normal indicis.  ROS  Review of Systems  Cardiovascular:  Negative for chest pain, dyspnea on exertion and leg swelling.   Physical Exam:   VS:  BP (!) 140/76   Pulse ROLLEN)  54   Ht 5' 10 (1.778 m)   Wt 197 lb (89.4 kg)   SpO2 98%   BMI 28.27 kg/m    Wt Readings from Last 3 Encounters:  07/24/24 197 lb (89.4 kg)  08/13/23 190 lb (86.2 kg)  07/23/23 190 lb (86.2 kg)    BP Readings from Last 3 Encounters:  07/24/24 (!) 140/76  03/14/24 (!) 144/83  06/25/23 125/80   Physical Exam Neck:     Vascular: No carotid bruit or JVD.  Cardiovascular:     Rate and Rhythm: Normal rate and regular rhythm.     Pulses: Intact distal pulses.     Heart sounds: Normal  heart sounds. No murmur heard.    No gallop.  Pulmonary:     Effort: Pulmonary effort is normal.     Breath sounds: Normal breath sounds.  Abdominal:     General: Bowel sounds are normal.     Palpations: Abdomen is soft.  Musculoskeletal:     Right lower leg: No edema.     Left lower leg: No edema.    EKG:    EKG Interpretation Date/Time:  Thursday July 24 2024 08:24:42 EDT Ventricular Rate:  56 PR Interval:  190 QRS Duration:  76 QT Interval:  418 QTC Calculation: 403 R Axis:   61  Text Interpretation: EKG 07/24/2024: Normal sinus rhythm at rate of 56 bpm, normal axis.  Nonspecific inferior sagging ST change.  Isolated T wave inversion in lead III. Compared to 09/15/2019, no significant change. Confirmed by Erma Raiche, Jagadeesh [phone]) on 07/24/2024 8:33:51 AM  EKG 04/23/2023: Normal sinus rhythm with rate of 60 bpm, left atrial enlargement, normal axis. Poor R progression, probably normal variant.   ASSESSMENT AND PLAN: .      ICD-10-CM   1. Hypercholesteremia  E78.00 EKG 12-Lead    atorvastatin  (LIPITOR) 10 MG tablet    2. Coronary artery disease involving native coronary artery of native heart without angina pectoris  I25.10 EKG 12-Lead    atorvastatin  (LIPITOR) 10 MG tablet    valsartan  (DIOVAN ) 40 MG tablet    CT CARDIAC SCORING (SELF PAY ONLY)    3. Statin myopathy  G72.0 EKG 12-Lead   T46.6X5A     4. Aneurysm of ascending aorta without rupture  I71.21 EKG 12-Lead    valsartan  (DIOVAN ) 40 MG tablet    CT CARDIAC SCORING (SELF PAY ONLY)    5. Essential (primary) hypertension  I10 EKG 12-Lead      Assessment and Plan Assessment & Plan Thoracic aortic aneurysm, stable mild dilation The thoracic aortic aneurysm shows mild dilation, measuring approximately 44 to 45 mm, and has remained stable since 2022. Echocardiogram indicates normal ascending aorta size. Surgical intervention is not required until it reaches 6 cm. Stability over the past two years suggests a low  risk of rapid progression. - Prescribe losartan  25 mg daily to prevent aortic enlargement. - Schedule a limited CT scan of the chest in one year to assess the size of the aortic aneurysm.  Atherosclerotic heart disease of native coronary artery without angina The condition is well-managed without angina symptoms. Focus is on managing risk factors, including hypercholesterolemia and blood pressure, to prevent progression. - Continue managing cholesterol and blood pressure as per the plans for hypercholesterolemia and hypertension.  Hypercholesterolemia with statin intolerance and suspected statin-induced myopathy He has hypercholesterolemia with intolerance to Repatha  and Praluent , and suspected statin-induced myopathy causing leg pain. He discontinued all cholesterol medications on May 31, 2024, and reports  improvement since. Plan is to reintroduce a statin at a low dose to manage cholesterol levels while minimizing side effects. - Prescribe a statin at 10 mg once a week, preferably on a Monday. - If tolerated, increase to twice a week, but do not exceed this frequency. - Monitor for any muscle pain or weakness and adjust the dosage accordingly.  Essential hypertension Blood pressure readings average 127/78 mmHg over the past month. He experiences dizziness with previous medications, affecting his ability to play tennis. Plan is to manage blood pressure with a medication that minimizes side effects. - Prescribe losartan  25 mg daily, to be taken in the evening, to manage blood pressure and prevent aortic enlargement.   Follow up: 1 year  Signed,  Gordy Bergamo, MD, [facility] 07/24/2024, 8:21 PM [facility] [address] [place], [place] [address] Phone: [phone]. Fax:  [phone]

## 2024-08-07 ENCOUNTER — Ambulatory Visit (HOSPITAL_COMMUNITY)
Admission: RE | Admit: 2024-08-07 | Discharge: 2024-08-07 | Disposition: A | Discharge status: Home / Self Care | Payer: Self-pay | Source: Ambulatory Visit | Attending: Internal Medicine | Admitting: Internal Medicine

## 2024-08-07 DIAGNOSIS — I7121 Aneurysm of the ascending aorta, without rupture: Secondary | ICD-10-CM | POA: Insufficient documentation

## 2024-08-07 DIAGNOSIS — I2510 Atherosclerotic heart disease of native coronary artery without angina pectoris: Secondary | ICD-10-CM | POA: Insufficient documentation

## 2024-08-08 ENCOUNTER — Ambulatory Visit: Payer: Self-pay | Admitting: Cardiology

## 2024-08-08 NOTE — Progress Notes (Signed)
 Coronary calcium  score 08/07/24: Coronary calcium  score of 733 AU. This was 79th percentile for age-, race-, and sex-matched controls. Mildly dilated ascending aorta, 43 mm.  No change in the aortic aneurysm since 2022.

## 2024-08-10 DIAGNOSIS — I10 Essential (primary) hypertension: Secondary | ICD-10-CM | POA: Diagnosis not present

## 2024-08-10 DIAGNOSIS — I2510 Atherosclerotic heart disease of native coronary artery without angina pectoris: Secondary | ICD-10-CM | POA: Diagnosis not present

## 2024-08-13 DIAGNOSIS — I2510 Atherosclerotic heart disease of native coronary artery without angina pectoris: Secondary | ICD-10-CM | POA: Diagnosis not present

## 2024-08-13 DIAGNOSIS — I10 Essential (primary) hypertension: Secondary | ICD-10-CM | POA: Diagnosis not present

## 2024-08-13 DIAGNOSIS — I340 Nonrheumatic mitral (valve) insufficiency: Secondary | ICD-10-CM | POA: Diagnosis not present

## 2024-08-15 DIAGNOSIS — I2510 Atherosclerotic heart disease of native coronary artery without angina pectoris: Secondary | ICD-10-CM | POA: Diagnosis not present

## 2024-08-15 DIAGNOSIS — I10 Essential (primary) hypertension: Secondary | ICD-10-CM | POA: Diagnosis not present

## 2024-08-15 DIAGNOSIS — E663 Overweight: Secondary | ICD-10-CM | POA: Diagnosis not present

## 2024-08-15 DIAGNOSIS — E7800 Pure hypercholesterolemia, unspecified: Secondary | ICD-10-CM | POA: Diagnosis not present

## 2024-08-27 ENCOUNTER — Ambulatory Visit (INDEPENDENT_AMBULATORY_CARE_PROVIDER_SITE_OTHER): Admitting: Otolaryngology

## 2024-08-27 ENCOUNTER — Encounter (INDEPENDENT_AMBULATORY_CARE_PROVIDER_SITE_OTHER): Payer: Self-pay | Admitting: Otolaryngology

## 2024-08-27 VITALS — BP 123/74 | HR 60 | Ht 70.0 in | Wt 195.0 lb

## 2024-08-27 DIAGNOSIS — Z09 Encounter for follow-up examination after completed treatment for conditions other than malignant neoplasm: Secondary | ICD-10-CM | POA: Diagnosis not present

## 2024-08-27 DIAGNOSIS — Z8669 Personal history of other diseases of the nervous system and sense organs: Secondary | ICD-10-CM | POA: Diagnosis not present

## 2024-08-27 DIAGNOSIS — H903 Sensorineural hearing loss, bilateral: Secondary | ICD-10-CM

## 2024-08-27 DIAGNOSIS — R42 Dizziness and giddiness: Secondary | ICD-10-CM

## 2024-08-27 DIAGNOSIS — H6981 Other specified disorders of Eustachian tube, right ear: Secondary | ICD-10-CM

## 2024-08-27 NOTE — Progress Notes (Signed)
 Patient ID: Scott Salas, male   DOB: [birth year], 73 y.o.   MRN: [id number]  Follow up: Recurrent right ear infection, hearing loss, recurrent dizziness  Discussed the use of AI scribe software for clinical note transcription with the patient, who gave verbal consent to proceed.  History of Present Illness Scott Salas is a 73 year old male who presents with right ear infections and hearing difficulties.  He has experienced a right ear infection within the last six months, characterized by a sensation of the ear feeling 'stopped up' and associated popping and cracking sounds. He attempted to clean his ear using a Q-tip and peroxide, which he believes led to the infection. He notes that the ear tube previously placed in his right ear has fallen out. No recent dizziness is reported. He is cautious about water exposure, especially when swimming.  He has concerns about his hearing, noting that he often has to repeat himself to his wife. He inquires about hearing aids and mentions that his last hearing test was a couple of years ago.  Exam: General: Communicates without difficulty, well nourished, no acute distress. Head: Normocephalic, no evidence injury, no tenderness, facial buttresses intact without stepoff. Face/sinus: No tenderness to palpation and percussion. Facial movement is normal and symmetric. Eyes: PERRL, EOMI. No scleral icterus, conjunctivae clear. Neuro: CN II exam reveals vision grossly intact.  No nystagmus at any point of gaze. Ears: Auricles well formed without lesions.  Ear canals are intact without mass or lesion.  No erythema or edema is appreciated.  The TMs are intact without fluid. Nose: External evaluation reveals normal support and skin without lesions.  Dorsum is intact.  Anterior rhinoscopy reveals congested mucosa over anterior aspect of inferior turbinates and intact septum.  No purulence noted. Oral:  Oral cavity and oropharynx are intact, symmetric, without  erythema or edema.  Mucosa is moist without lesions. Neck: Full range of motion without pain.  There is no significant lymphadenopathy.  No masses palpable.  Thyroid  bed within normal limits to palpation.  Parotid glands and submandibular glands equal bilaterally without mass.  Trachea is midline. Neuro:  CN 2-12 grossly intact.    Assessment and Plan Assessment & Plan Recurrent right ear infections Recent infection with drainage. No current infection observed. Advised against using peroxide and irrigation due to increased infection risk. No dizziness reported. - Advised against using peroxide and irrigation in the ear. - Recommended using a washcloth to clean the outer ear. - Advised against swimming without earplugs to prevent water entry. - Scheduled follow-up in six months.  Bilateral high-frequency sensorineural hearing loss Moderate hearing loss noted in previous test from 2023. Reports difficulty hearing and repeating himself. Discussed over-the-counter hearing aids and AirPods Pro 2 as options for mild to moderate hearing loss. If hearing loss has worsened, a real hearing aid may be necessary. - Scheduled a hearing test to assess current hearing status. - Discussed over-the-counter hearing aids and AirPods Pro 2 as options for mild to moderate hearing loss. - Advised that if hearing loss has worsened, a real hearing aid may be necessary.

## 2024-08-29 ENCOUNTER — Ambulatory Visit (INDEPENDENT_AMBULATORY_CARE_PROVIDER_SITE_OTHER): Admitting: Audiology

## 2024-09-09 DIAGNOSIS — I2510 Atherosclerotic heart disease of native coronary artery without angina pectoris: Secondary | ICD-10-CM | POA: Diagnosis not present

## 2024-09-09 DIAGNOSIS — I10 Essential (primary) hypertension: Secondary | ICD-10-CM | POA: Diagnosis not present

## 2024-09-14 DIAGNOSIS — E663 Overweight: Secondary | ICD-10-CM | POA: Diagnosis not present

## 2024-09-14 DIAGNOSIS — E7800 Pure hypercholesterolemia, unspecified: Secondary | ICD-10-CM | POA: Diagnosis not present

## 2024-09-14 DIAGNOSIS — I2510 Atherosclerotic heart disease of native coronary artery without angina pectoris: Secondary | ICD-10-CM | POA: Diagnosis not present

## 2024-09-14 DIAGNOSIS — I10 Essential (primary) hypertension: Secondary | ICD-10-CM | POA: Diagnosis not present

## 2024-09-15 ENCOUNTER — Ambulatory Visit (INDEPENDENT_AMBULATORY_CARE_PROVIDER_SITE_OTHER): Admitting: Otolaryngology

## 2024-09-24 DIAGNOSIS — H25013 Cortical age-related cataract, bilateral: Secondary | ICD-10-CM | POA: Diagnosis not present

## 2024-09-24 DIAGNOSIS — H5213 Myopia, bilateral: Secondary | ICD-10-CM | POA: Diagnosis not present

## 2024-09-24 DIAGNOSIS — H2513 Age-related nuclear cataract, bilateral: Secondary | ICD-10-CM | POA: Diagnosis not present

## 2024-09-24 DIAGNOSIS — H35372 Puckering of macula, left eye: Secondary | ICD-10-CM | POA: Diagnosis not present

## 2024-09-24 DIAGNOSIS — H52203 Unspecified astigmatism, bilateral: Secondary | ICD-10-CM | POA: Diagnosis not present

## 2024-09-29 DIAGNOSIS — R319 Hematuria, unspecified: Secondary | ICD-10-CM | POA: Diagnosis not present

## 2025-02-12 ENCOUNTER — Ambulatory Visit (INDEPENDENT_AMBULATORY_CARE_PROVIDER_SITE_OTHER): Admitting: Otolaryngology
